# Patient Record
Sex: Female | Born: 1965
Health system: Southern US, Community
[De-identification: ages and names within clinical notes are randomized; demographics above are authoritative.]

## PROBLEM LIST (undated history)

## (undated) DIAGNOSIS — I10 Essential (primary) hypertension: Secondary | ICD-10-CM

## (undated) HISTORY — PX: UTERINE FIBROID SURGERY: SHX826

---

## 1998-07-31 ENCOUNTER — Emergency Department (HOSPITAL_COMMUNITY): Admission: EM | Admit: 1998-07-31 | Discharge: 1998-08-01 | Payer: Self-pay

## 2004-04-28 ENCOUNTER — Other Ambulatory Visit: Admission: RE | Admit: 2004-04-28 | Discharge: 2004-04-28 | Payer: Self-pay | Admitting: Gynecology

## 2004-07-18 ENCOUNTER — Inpatient Hospital Stay (HOSPITAL_COMMUNITY): Admission: RE | Admit: 2004-07-18 | Discharge: 2004-07-20 | Payer: Self-pay | Admitting: Gynecology

## 2011-05-09 ENCOUNTER — Emergency Department (HOSPITAL_BASED_OUTPATIENT_CLINIC_OR_DEPARTMENT_OTHER)
Admission: EM | Admit: 2011-05-09 | Discharge: 2011-05-09 | Disposition: A | Discharge status: Home / Self Care | Payer: Managed Care, Other (non HMO) | Attending: Emergency Medicine | Admitting: Emergency Medicine

## 2011-05-09 ENCOUNTER — Encounter: Payer: Self-pay | Admitting: Emergency Medicine

## 2011-05-09 DIAGNOSIS — M25561 Pain in right knee: Secondary | ICD-10-CM

## 2011-05-09 DIAGNOSIS — M25569 Pain in unspecified knee: Secondary | ICD-10-CM | POA: Insufficient documentation

## 2011-05-09 MED ORDER — IBUPROFEN 800 MG PO TABS: 800.0000 mg | ORAL_TABLET | Freq: Three times a day (TID) | ORAL | Status: AC

## 2011-05-09 MED ORDER — HYDROCODONE-ACETAMINOPHEN 5-325 MG PO TABS: 1.0000 | ORAL_TABLET | ORAL | Status: AC | PRN

## 2011-05-09 NOTE — ED Notes (Signed)
Pt reports loud pop of R knee last PM while getting up to stand no deformity no trauma

## 2011-05-09 NOTE — ED Provider Notes (Signed)
History     Chief Complaint  Patient presents with  . Knee Injury   HPI Pt was standing up from seated position yesterday evening when she heard/felt a pop in right knee.  Has had severe pain as well as edema ever since.  Able to bear weight but pain.  Pain aggravated by flexing as well.  No associated paresthesias.  Has had pain as well as intermittent popping/catching in both knees in the past and strong FH of arthritis. No recent injury to knee.  Otherwise feeling well.   No past medical history on file.  Past Surgical History  Procedure Date  . Colon surgery     History reviewed. No pertinent family history.  History  Substance Use Topics  . Smoking status: Not on file  . Smokeless tobacco: Not on file  . Alcohol Use: No    OB History    Grav Para Term Preterm Abortions TAB SAB Ect Mult Living                  Review of Systems  All other systems reviewed and are negative.    Physical Exam  BP 169/100  Pulse 99  Temp 98.7 F (37.1 C)  Resp 18  Physical Exam  Nursing note and vitals reviewed. Constitutional: She is oriented to person, place, and time. She appears well-developed and well-nourished. No distress.  HENT:  Head: Normocephalic and atraumatic.  Eyes:       Normal appearance  Neck: Normal range of motion.  Cardiovascular: Normal rate and regular rhythm.   Pulmonary/Chest: Effort normal and breath sounds normal.  Musculoskeletal:       Right knee: She exhibits no swelling, no ecchymosis, no deformity and no erythema. tenderness found.       No erythema or warmth.  Tenderness at medial joint line and medial to patella only.  Full active ROM but pain w/ complete flexion.  Distal NV intact.  Pt able to bear weight.    Neurological: She is alert and oriented to person, place, and time.  Skin: Skin is warm and dry. No rash noted.  Psychiatric: She has a normal mood and affect. Her behavior is normal.    ED Course  Procedures  MDM Pt presents w/  non-traumatic right knee pain since feeling a pop when standing from a seated position yesterday.  Has had pain, popping and catching in the recent past as well.  No signs of trauma or infection on exam.  Pt likely has a cartilage tear or acute flare of arthritis.  Pt has a knee sleeve at home and declines crutches.  Discharged home w/ vicodin, ibuprofen and recommendations for conservative therapy.  Referred to ortho.       Otilio Miu, PA 05/09/11 1407  Otilio Miu, [place] 05/09/11 [phone]

## 2011-05-26 ENCOUNTER — Other Ambulatory Visit: Payer: Self-pay | Admitting: Orthopedic Surgery

## 2011-05-26 ENCOUNTER — Ambulatory Visit
Admission: RE | Admit: 2011-05-26 | Discharge: 2011-05-26 | Disposition: A | Discharge status: Home / Self Care | Payer: Managed Care, Other (non HMO) | Source: Ambulatory Visit | Attending: Orthopedic Surgery | Admitting: Orthopedic Surgery

## 2011-05-26 DIAGNOSIS — M25561 Pain in right knee: Secondary | ICD-10-CM

## 2011-06-10 NOTE — ED Provider Notes (Signed)
Medical screening examination/treatment/procedure(s) were performed by non-physician practitioner and as supervising physician I was immediately available for consultation/collaboration. Osvaldo Human, M.D.  Carleene Cooper III, MD 06/10/11 2126

## 2014-12-07 ENCOUNTER — Encounter (HOSPITAL_BASED_OUTPATIENT_CLINIC_OR_DEPARTMENT_OTHER): Payer: Self-pay | Admitting: *Deleted

## 2014-12-07 ENCOUNTER — Emergency Department (HOSPITAL_BASED_OUTPATIENT_CLINIC_OR_DEPARTMENT_OTHER)
Admission: EM | Admit: 2014-12-07 | Discharge: 2014-12-07 | Disposition: A | Discharge status: Home / Self Care | Payer: Managed Care, Other (non HMO) | Attending: Emergency Medicine | Admitting: Emergency Medicine

## 2014-12-07 DIAGNOSIS — I10 Essential (primary) hypertension: Secondary | ICD-10-CM | POA: Insufficient documentation

## 2014-12-07 DIAGNOSIS — M79671 Pain in right foot: Secondary | ICD-10-CM | POA: Insufficient documentation

## 2014-12-07 DIAGNOSIS — M79672 Pain in left foot: Secondary | ICD-10-CM | POA: Insufficient documentation

## 2014-12-07 DIAGNOSIS — Z720 Tobacco use: Secondary | ICD-10-CM | POA: Insufficient documentation

## 2014-12-07 DIAGNOSIS — Z79899 Other long term (current) drug therapy: Secondary | ICD-10-CM | POA: Insufficient documentation

## 2014-12-07 DIAGNOSIS — M79606 Pain in leg, unspecified: Secondary | ICD-10-CM

## 2014-12-07 DIAGNOSIS — Z791 Long term (current) use of non-steroidal anti-inflammatories (NSAID): Secondary | ICD-10-CM | POA: Insufficient documentation

## 2014-12-07 DIAGNOSIS — R000 Tachycardia, unspecified: Secondary | ICD-10-CM | POA: Insufficient documentation

## 2014-12-07 HISTORY — DX: Essential (primary) hypertension: I10

## 2014-12-07 LAB — COMPREHENSIVE METABOLIC PANEL
ALT: 24 U/L (ref 0–35)
AST: 60 U/L — AB (ref 0–37)
Albumin: 4 g/dL (ref 3.5–5.2)
Alkaline Phosphatase: 75 U/L (ref 39–117)
Anion gap: 8 (ref 5–15)
BUN: 5 mg/dL — ABNORMAL LOW (ref 6–23)
CHLORIDE: 95 mmol/L — AB (ref 96–112)
CO2: 32 mmol/L (ref 19–32)
Calcium: 8.9 mg/dL (ref 8.4–10.5)
Creatinine, Ser: 0.48 mg/dL — ABNORMAL LOW (ref 0.50–1.10)
Glucose, Bld: 109 mg/dL — ABNORMAL HIGH (ref 70–99)
Potassium: 2.8 mmol/L — ABNORMAL LOW (ref 3.5–5.1)
SODIUM: 135 mmol/L (ref 135–145)
Total Bilirubin: 0.9 mg/dL (ref 0.3–1.2)
Total Protein: 7.3 g/dL (ref 6.0–8.3)

## 2014-12-07 LAB — CBC WITH DIFFERENTIAL/PLATELET
BASOS ABS: 0 10*3/uL (ref 0.0–0.1)
BASOS PCT: 0 % (ref 0–1)
Eosinophils Absolute: 0 10*3/uL (ref 0.0–0.7)
Eosinophils Relative: 0 % (ref 0–5)
HEMATOCRIT: 33.8 % — AB (ref 36.0–46.0)
Hemoglobin: 11 g/dL — ABNORMAL LOW (ref 12.0–15.0)
Lymphocytes Relative: 34 % (ref 12–46)
Lymphs Abs: 1.8 10*3/uL (ref 0.7–4.0)
MCH: 30.6 pg (ref 26.0–34.0)
MCHC: 32.5 g/dL (ref 30.0–36.0)
MCV: 94.2 fL (ref 78.0–100.0)
MONOS PCT: 10 % (ref 3–12)
Monocytes Absolute: 0.5 10*3/uL (ref 0.1–1.0)
Neutro Abs: 2.8 10*3/uL (ref 1.7–7.7)
Neutrophils Relative %: 56 % (ref 43–77)
Platelets: 276 10*3/uL (ref 150–400)
RBC: 3.59 MIL/uL — ABNORMAL LOW (ref 3.87–5.11)
RDW: 14 % (ref 11.5–15.5)
WBC: 5.1 10*3/uL (ref 4.0–10.5)

## 2014-12-07 LAB — CBG MONITORING, ED: Glucose-Capillary: 101 mg/dL — ABNORMAL HIGH (ref 70–99)

## 2014-12-07 MED ORDER — HYDROCODONE-ACETAMINOPHEN 5-325 MG PO TABS: 1.0000 | ORAL_TABLET | ORAL | Status: DC | PRN

## 2014-12-07 MED ORDER — SODIUM CHLORIDE 0.9 % IV BOLUS (SEPSIS)
1000.0000 mL | Freq: Once | INTRAVENOUS | Status: AC
Start: 2014-12-07 — End: 2014-12-07
  Administered 2014-12-07: 1000 mL via INTRAVENOUS

## 2014-12-07 MED ORDER — SODIUM CHLORIDE 0.9 % IV BOLUS (SEPSIS)
1000.0000 mL | Freq: Once | INTRAVENOUS | Status: AC
  Administered 2014-12-07: 1000 mL via INTRAVENOUS

## 2014-12-07 MED ORDER — POTASSIUM CHLORIDE CRYS ER 20 MEQ PO TBCR
40.0000 meq | EXTENDED_RELEASE_TABLET | Freq: Once | ORAL | Status: AC
  Administered 2014-12-07: 40 meq via ORAL
  Filled 2014-12-07: qty 2

## 2014-12-07 MED ORDER — METOPROLOL TARTRATE 1 MG/ML IV SOLN
2.5000 mg | Freq: Once | INTRAVENOUS | Status: AC
  Administered 2014-12-07: 2.5 mg via INTRAVENOUS
  Filled 2014-12-07: qty 5

## 2014-12-07 MED ORDER — METHOCARBAMOL 500 MG PO TABS: 500.0000 mg | ORAL_TABLET | Freq: Two times a day (BID) | ORAL | Status: DC

## 2014-12-07 MED ORDER — LORAZEPAM 2 MG/ML IJ SOLN
0.5000 mg | Freq: Once | INTRAMUSCULAR | Status: AC
  Administered 2014-12-07: 0.5 mg via INTRAVENOUS
  Filled 2014-12-07: qty 1

## 2014-12-07 MED ORDER — SODIUM CHLORIDE 0.9 % IV SOLN: INTRAVENOUS | Status: DC

## 2014-12-07 NOTE — ED Provider Notes (Addendum)
CSN: [id number]     Arrival date & time 12/07/14  0915 History   First MD Initiated Contact with Patient 12/07/14 0932     Chief Complaint  Patient presents with  . Numbness     (Consider location/radiation/quality/duration/timing/severity/associated sxs/prior Treatment) HPI Comments: History of present illness complaining of bilateral foot numbness 2 months which is been progressive. Complains of pain to the soles of her foot which is making difficult for her to walk. She notes that she has had some extension of the symptoms into her lower extremities. Denies any upper extremity symptoms. No headache. No polyuria polydipsia. Was seen at urgent care for similar symptoms and was told to monitor her situation. Denies any back pain. No bowel or bladder dysfunction. No visual changes. Symptoms are worse with ambulation better with rest.  The history is provided by the patient.    Past Medical History  Diagnosis Date  . Hypertension    Past Surgical History  Procedure Laterality Date  . Uterine fibroid surgery     No family history on file. History  Substance Use Topics  . Smoking status: Current Every Day Smoker    Types: Cigarettes  . Smokeless tobacco: Never Used  . Alcohol Use: Yes     Comment: 6/wk   OB History    No data available     Review of Systems  All other systems reviewed and are negative.     Allergies  Review of patient's allergies indicates no known allergies.  Home Medications   Prior to Admission medications   Medication Sig Start Date End Date Taking? Authorizing Provider  amLODipine (NORVASC) 10 MG tablet Take 10 mg by mouth daily.   Yes Historical Provider, MD  lisinopril (PRINIVIL,ZESTRIL) 40 MG tablet Take 40 mg by mouth daily.   Yes Historical Provider, MD  simvastatin (ZOCOR) 10 MG tablet Take 10 mg by mouth daily.   Yes Historical Provider, MD  naproxen sodium (ANAPROX) 220 MG tablet Take 220 mg by mouth 2 (two) times daily with a meal.       Historical Provider, MD   BP 152/97 mmHg  Pulse 118  Temp(Src) 98.2 F (36.8 C) (Oral)  Resp 18  SpO2 97% Physical Exam  Constitutional: She is oriented to person, place, and time. She appears well-developed and well-nourished.  Non-toxic appearance. No distress.  HENT:  Head: Normocephalic and atraumatic.  Eyes: Conjunctivae, EOM and lids are normal. Pupils are equal, round, and reactive to light.  Neck: Normal range of motion. Neck supple. No tracheal deviation present. No thyroid mass present.  Cardiovascular: Normal rate, regular rhythm and normal heart sounds.  Exam reveals no gallop.   No murmur heard. Pulmonary/Chest: Effort normal and breath sounds normal. No stridor. No respiratory distress. She has no decreased breath sounds. She has no wheezes. She has no rhonchi. She has no rales.  Abdominal: Soft. Normal appearance and bowel sounds are normal. She exhibits no distension. There is no tenderness. There is no rebound and no CVA tenderness.  Musculoskeletal: Normal range of motion. She exhibits no edema or tenderness.  No discoloration noted to her toes or feet bilaterally. Skin intact. Sensation normal.  Neurological: She is alert and oriented to person, place, and time. She has normal strength. No cranial nerve deficit or sensory deficit. GCS eye subscore is 4. GCS verbal subscore is 5. GCS motor subscore is 6.  Reflex Scores:      Patellar reflexes are 2+ on the right side and 2+ on  the left side. Skin: Skin is warm and dry. No abrasion and no rash noted.  Psychiatric: She has a normal mood and affect. Her speech is normal and behavior is normal.  Nursing note and vitals reviewed.   ED Course  Procedures (including critical care time) Labs Review Labs Reviewed - No data to display  Imaging Review No results found.   EKG Interpretation   Date/Time:  Friday December 07 2014 09:54:33 EST Ventricular Rate:  125 PR Interval:  138 QRS Duration: 72 QT Interval:   328 QTC Calculation: 473 R Axis:   49 Text Interpretation:  Sinus tachycardia Nonspecific ST abnormality  Abnormal ECG Confirmed by Jerico Grisso  MD, Gari Trovato ([id number]) on 12/07/2014 1:21:22  PM      MDM   Final diagnoses:  None    CBG checked here. Suspect the patient has claudication versus neuropathy. Will be given referral to neurology  1:19 PM Patient with tachycardia of unexplained etiology initially. Patient given Ativan with no response. She did admit to feeling more anxious.Given IV fluids and remained tachycardic. Patient admits now that she has not taken her antihypertensive medications and I think this could be a factor with her increased heart rate. She was given Lopressor 2.5 mg with a good response of her heart rate. She has remained asymptomatic and denies chest pain or palpitations or dyspnea. Will take her blood pressure medication when she gets home and was given referral as above.  CRITICAL CARE Performed by: Toy Baker Total critical care time: 45 Critical care time was exclusive of separately billable procedures and treating other patients. Critical care was necessary to treat or prevent imminent or life-threatening deterioration. Critical care was time spent personally by me on the following activities: development of treatment plan with patient and/or surrogate as well as nursing, discussions with consultants, evaluation of patient's response to treatment, examination of patient, obtaining history from patient or surrogate, ordering and performing treatments and interventions, ordering and review of laboratory studies, ordering and review of radiographic studies, pulse oximetry and re-evaluation of patient's condition.  Toy Baker, MD 12/07/14 6045  Toy Baker, MD 12/07/14 1321

## 2014-12-07 NOTE — ED Notes (Signed)
Reports numbness in both feet x 2 months- progressively worsening- states hurts to walk

## 2014-12-07 NOTE — ED Notes (Signed)
EDP back in with pt-per EDP he was advised by pt's boyfriend that pt has not been taking her BP meds

## 2014-12-07 NOTE — ED Notes (Signed)
MD at bedside. 

## 2014-12-07 NOTE — ED Notes (Signed)
EDP Allen updated on pt's VS

## 2014-12-07 NOTE — Discharge Instructions (Signed)
Return to the hospital if you develop trouble speaking, extension of the numbness into your arms, trouble breathing, or any other problems. Call the neurologist to schedule a follow-up visit

## 2014-12-12 ENCOUNTER — Encounter: Payer: Self-pay | Admitting: Diagnostic Neuroimaging

## 2014-12-12 ENCOUNTER — Ambulatory Visit (INDEPENDENT_AMBULATORY_CARE_PROVIDER_SITE_OTHER): Payer: Self-pay | Admitting: Diagnostic Neuroimaging

## 2014-12-12 VITALS — BP 148/99 | HR 140 | Ht 65.0 in | Wt 144.2 lb

## 2014-12-12 DIAGNOSIS — M79671 Pain in right foot: Secondary | ICD-10-CM

## 2014-12-12 DIAGNOSIS — M79672 Pain in left foot: Secondary | ICD-10-CM

## 2014-12-12 DIAGNOSIS — R200 Anesthesia of skin: Secondary | ICD-10-CM

## 2014-12-12 DIAGNOSIS — G609 Hereditary and idiopathic neuropathy, unspecified: Secondary | ICD-10-CM

## 2014-12-12 DIAGNOSIS — R208 Other disturbances of skin sensation: Secondary | ICD-10-CM

## 2014-12-12 MED ORDER — GABAPENTIN 300 MG PO CAPS: 300.0000 mg | ORAL_CAPSULE | Freq: Every day | ORAL | Status: DC

## 2014-12-12 NOTE — Patient Instructions (Signed)
I will check additional testing.  Follow up with primary doctor about high blood pressure and fast heart rate this week.  Try gabapentin 300mg  at bedtime for 1-2 weeks, then increase to twice a day.

## 2014-12-12 NOTE — Progress Notes (Signed)
[facility]  PATIENT: Laura Stafford DOB: [birth year]  REFERRING CLINICIAN: ER Cordelia Poche) HISTORY FROM: patient  REASON FOR VISIT: new consult    HISTORICAL  CHIEF COMPLAINT:  Chief Complaint  Patient presents with  . New Evaluation    tingling and pins and needles numbness in both feet worsening over past two months    HISTORY OF PRESENT ILLNESS:   49 year old right-handed female with hypertension, hypercholesteremia, daily alcohol use, here for evaluation of numbness and pain in feet and hands. November 2015 patient had onset of numbness and tingling in the toes, feet, which gradually spread to the ankles, calves and knees. Now she has some numbness and tingling in her hands for past few weeks. Patient describes pins and needles, painful sensation. Walking and physical activity make this worse.  Of note patient has been inconsistent with taking her medications for high blood pressure and high cholesterol. She was noted to be hypertensive and tachycardic in the emergency room a few days ago. Her vital signs show similar hypertension and tachycardia today. Patient reports daily alcohol use, at least 1-2 mixed drink per day every day. She smokes 5 cigars per day. She denies any illicit drug use or caffeine use.   REVIEW OF SYSTEMS: Full 14 system review of systems performed and notable only for joint swelling and numbness.   ALLERGIES: No Known Allergies  HOME MEDICATIONS: Outpatient Prescriptions Prior to Visit  Medication Sig Dispense Refill  . amLODipine (NORVASC) 10 MG tablet Take 10 mg by mouth daily.    Marland Kitchen HYDROcodone-acetaminophen (NORCO/VICODIN) 5-325 MG per tablet Take 1-2 tablets by mouth every 4 (four) hours as needed for moderate pain or severe pain. 15 tablet 0  . lisinopril (PRINIVIL,ZESTRIL) 40 MG tablet Take 40 mg by mouth daily.    . naproxen sodium (ANAPROX) 220 MG tablet Take 220 mg by mouth 2 (two) times daily with a meal.      .  simvastatin (ZOCOR) 10 MG tablet Take 10 mg by mouth daily.    . methocarbamol (ROBAXIN) 500 MG tablet Take 1 tablet (500 mg total) by mouth 2 (two) times daily. (Patient not taking: Reported on 12/12/2014) 20 tablet 0   No facility-administered medications prior to visit.    PAST MEDICAL HISTORY: Past Medical History  Diagnosis Date  . Hypertension     PAST SURGICAL HISTORY: Past Surgical History  Procedure Laterality Date  . Uterine fibroid surgery      FAMILY HISTORY: Family History  Problem Relation Age of Onset  . Hypertension Mother     SOCIAL HISTORY:  History   Social History  . Marital Status: Single    Spouse Name: N/A  . Number of Children: 0  . Years of Education: N/A   Occupational History  . [employer]    Social History Main Topics  . Smoking status: Current Every Day Smoker -- 0.25 packs/day for 0 years    Types: Cigarettes  . Smokeless tobacco: Never Used  . Alcohol Use: 4.2 - 8.4 oz/week    7-14 Shots of liquor per week     Comment: 1-2 mixed drinks per day, every day  . Drug Use: No  . Sexual Activity: Not on file   Other Topics Concern  . Not on file   Social History Narrative   Lives alone   Currently engaged to [place]   Drinks no caffeine      PHYSICAL EXAM  Filed Vitals:   12/12/14 1056  BP: 148/99  Pulse: 140  Height: 5\' 5"  (1.651 m)  Weight: 144 lb 3.2 oz (65.409 kg)    Body mass index is 24 kg/(m^2).  No exam data present  No flowsheet data found.  GENERAL EXAM: Patient is in no distress; well developed, nourished and groomed; neck is supple  CARDIOVASCULAR: TACHYCARDIA (120-140'S); REGULAR RHYTHM; No murmurs, no carotid bruits  NEUROLOGIC: MENTAL STATUS: awake, alert, oriented to person, place and time, recent and remote memory intact, normal attention and concentration, language fluent, comprehension intact, naming intact, fund of knowledge appropriate; FLAT AFFECT. CRANIAL NERVE: no papilledema on  fundoscopic exam, pupils equal and reactive to light, visual fields full to confrontation, extraocular muscles intact, no nystagmus, facial sensation and strength symmetric, hearing intact, palate elevates symmetrically, uvula midline, shoulder shrug symmetric, tongue midline. MOTOR: normal bulk and tone, full strength in the BUE, BLE SENSORY: DECR PP, TEMP VIB IN TOES/FEET; VIB <2 SEC AT TOES COORDINATION: finger-nose-finger, fine finger movements normal REFLEXES: deep tendon reflexes present and symmetric; ABSENT AT ANKLES; DOWN GOING TOES GAIT/STATION: narrow based gait; SLIGHTLY DIFF WITH TANDEM, TOE AND HEEL WALKING; romberg is negative    DIAGNOSTIC DATA (LABS, IMAGING, TESTING) - I reviewed patient records, labs, notes, testing and imaging myself where available.  Lab Results  Component Value Date   WBC 5.1 12/07/2014   HGB 11.0* 12/07/2014   HCT 33.8* 12/07/2014   MCV 94.2 12/07/2014   PLT 276 12/07/2014      Component Value Date/Time   NA 135 12/07/2014 1010   K 2.8* 12/07/2014 1010   CL 95* 12/07/2014 1010   CO2 32 12/07/2014 1010   GLUCOSE 109* 12/07/2014 1010   BUN 5* 12/07/2014 1010   CREATININE 0.48* 12/07/2014 1010   CALCIUM 8.9 12/07/2014 1010   PROT 7.3 12/07/2014 1010   ALBUMIN 4.0 12/07/2014 1010   AST 60* 12/07/2014 1010   ALT 24 12/07/2014 1010   ALKPHOS 75 12/07/2014 1010   BILITOT 0.9 12/07/2014 1010   GFRNONAA >90 12/07/2014 1010   GFRAA >90 12/07/2014 1010   No results found for: CHOL, HDL, LDLCALC, LDLDIRECT, TRIG, CHOLHDL No results found for: ZOXW9UHGBA1C No results found for: VITAMINB12 No results found for: TSH     ASSESSMENT AND PLAN  49 y.o. year old female here with uncontrolled hypertension, tachycardia, daily alcohol use, here for evaluation of numbness and tingling in the feet. Labs notable for anemia, hypokalemia, slight elevation of AST and glucose. I am concerned that patient does not appear to be taking good care of her general health  status.  Ddx: Most likely represents peripheral neuropathy, due to chronic alcohol use. We'll check lab testing so look for other metabolic causes.   PLAN: - I will prescribe gabapentin for symptom control.  - Advised patient to have close follow-up with PCP for treatment of hypertension and tachycardia.  - Cautioned patient regarding excessive alcohol use.   Orders Placed This Encounter  Procedures  . Vitamin B12  . Hemoglobin A1c  . TSH  . T4, Free    Meds ordered this encounter  Medications  . gabapentin (NEURONTIN) 300 MG capsule    Sig: Take 1 capsule (300 mg total) by mouth at bedtime.    Dispense:  90 capsule    Refill:  3    Return in about 3 months (around 03/12/2015).    Suanne MarkerVIKRAM R. PENUMALLI, MD 12/12/2014, 11:35 AM Certified in Neurology, Neurophysiology and Neuroimaging  [facility] [address], [address] [place],  [place] [address] [phone]

## 2014-12-13 LAB — T4, FREE: FREE T4: 1.1 ng/dL (ref 0.82–1.77)

## 2014-12-13 LAB — HEMOGLOBIN A1C
ESTIMATED AVERAGE GLUCOSE: 97 mg/dL
Hgb A1c MFr Bld: 5 % (ref 4.8–5.6)

## 2014-12-13 LAB — VITAMIN B12: VITAMIN B 12: 582 pg/mL (ref 211–946)

## 2014-12-13 LAB — TSH: TSH: 1.45 u[IU]/mL (ref 0.450–4.500)

## 2014-12-17 ENCOUNTER — Telehealth: Payer: Self-pay | Admitting: *Deleted

## 2014-12-17 NOTE — Telephone Encounter (Signed)
Patient was last seen last Wednesday and wants to discuss lab results and Gabapentin 300 mg. The medication is not helping with the numbness or the tingling.

## 2014-12-17 NOTE — Telephone Encounter (Signed)
Entered in error

## 2014-12-17 NOTE — Telephone Encounter (Signed)
Spoke with the pt on the phone and informed her that the gabapentin was not going to be an overnight fix that it would gradually make the tingling and the numbness better but it would take time. She wanted to know if she could take more than one a day. Looking into the chart, Dr. Marjory LiesPenumalli ordered for her to take 1 300 mg capsule at bedtime for 1-2 weeks and then increase to 1 300 mg capsule twice a day. I encouraged her to wait for the 2 week mark which would be next Wednesday march 9th but if she needed to start taking the two capsules a day she could start on Thursday march 3rd 2016. I also asked her about her BP and HR and she told me she has an appt with her PCP next week and she will be seeing them for a poss medication change. Asked her to call back if she had any further questions.

## 2014-12-31 ENCOUNTER — Telehealth: Payer: Self-pay | Admitting: Diagnostic Neuroimaging

## 2014-12-31 MED ORDER — GABAPENTIN 300 MG PO CAPS: 300.0000 mg | ORAL_CAPSULE | Freq: Two times a day (BID) | ORAL | Status: DC

## 2014-12-31 NOTE — Telephone Encounter (Signed)
Last OV note says: Try gabapentin 300mg  at bedtime for 1-2 weeks, then increase to twice a day Rx has been sent.  I called back to advise.  She is aware.

## 2014-12-31 NOTE — Telephone Encounter (Signed)
Pt is calling stating she needs a new Rx for  gabapentin (NEURONTIN) 300 MG capsule. The one at the pharmacy is for 1 a day, she states it should be 2 a day.  Please call and advise.

## 2015-01-08 ENCOUNTER — Telehealth: Payer: Self-pay | Admitting: Diagnostic Neuroimaging

## 2015-01-08 ENCOUNTER — Telehealth: Payer: Self-pay | Admitting: *Deleted

## 2015-01-08 ENCOUNTER — Encounter: Payer: Self-pay | Admitting: *Deleted

## 2015-01-08 NOTE — Telephone Encounter (Signed)
Increase gabapentin to three times per day. May follow up in clinic in next 1 month if needed. -VRP

## 2015-01-08 NOTE — Telephone Encounter (Signed)
Patient stated dosage increase of Rx gabapentin (NEURONTIN) 300 MG capsule, has not helped in pain or symptoms.  Please call and advise.

## 2015-01-08 NOTE — Telephone Encounter (Signed)
Patient stated shejust spoke with you Shanda BumpsJessica about her Rx Gabapetin 300 mg. Patient states she needs to talk with you some more about how the medication is working for her.  Thanks!

## 2015-01-08 NOTE — Telephone Encounter (Signed)
Patient called back.  Wanted to add that she is having tingling, numbness and spasm in feet.  Says her pain has increased since she was last seen.  Indicates at times she also has sharp pain between her toes, but it does not last very long.  She also wanted to relay she has decreased her alcohol intake.

## 2015-01-08 NOTE — Telephone Encounter (Signed)
Called and spoke with the pt encouraged her to take the gabapentin TID per Dr. Richrd HumblesPenumalli's order. I also told her that if in about a month the new dose was not helping, to call back and get another appt with us. She stated an understanding and a thanks.

## 2015-01-08 NOTE — Telephone Encounter (Signed)
I called back.  Please see previous note.

## 2015-01-08 NOTE — Telephone Encounter (Signed)
I called back and spoke with the patient.  Says she is taking Gabapentin twice daily, but is finding no pain relief.  Pain is mainly in her feet and toes.  States she has not been sleeping well, so has been taking Advil PM in the evening.  Would like a message sent to provider asking if they recommend a different prescription.  Please advise.  Thank you.

## 2015-01-16 ENCOUNTER — Telehealth: Payer: Self-pay | Admitting: Diagnostic Neuroimaging

## 2015-01-16 DIAGNOSIS — M792 Neuralgia and neuritis, unspecified: Secondary | ICD-10-CM

## 2015-01-16 NOTE — Telephone Encounter (Signed)
Patient is calling because the Gabapentin is causing swelling in her knees and feet. Please call to discuss. Thank you.

## 2015-01-17 MED ORDER — PREGABALIN 50 MG PO CAPS
50.0000 mg | ORAL_CAPSULE | Freq: Three times a day (TID) | ORAL | Status: DC
Start: 2015-01-17 — End: 2015-02-06

## 2015-01-17 NOTE — Telephone Encounter (Signed)
Foot pain , swelling under neurontin- she would like to change medication and feels the pain and dysesthesias are deeper than just skin deep, would not try a pain cream.  I suggested to try LYRICA> she would like to go ahead, CC Dr Mardi MainlandPanumalli

## 2015-01-18 ENCOUNTER — Telehealth: Payer: Self-pay | Admitting: Neurology

## 2015-01-18 NOTE — Telephone Encounter (Signed)
Reports Lyrica not at pharmacy  I called [facility]    [phone] and dictated prescription 50 mg po tid   #90 2 refills

## 2015-01-18 NOTE — Telephone Encounter (Signed)
Patient checking status of Rx Lyrica, please forward to [facility] [address] [place].  Questioning what dosage is she going to start off on?  Pain is really bad and would like to get this Rx today.  Please call advise.

## 2015-01-21 NOTE — Telephone Encounter (Signed)
Spoke with the pt on the phone and confirmed that she has picked up her lyrica. She stated that it is not working, I encouraged her to give the medication a few weeks to work best and that if she was still hurting, I would speak with Dr. Marjory LiesPenumalli about increasing her dose. She thanked me

## 2015-02-04 ENCOUNTER — Telehealth: Payer: Self-pay | Admitting: Diagnostic Neuroimaging

## 2015-02-04 NOTE — Telephone Encounter (Signed)
Patient called wanting to speak to a nurse regarding the swelling on both feet, ankles, and knees she is having LYRICA 50mg  tid. Please call and advice.

## 2015-02-05 NOTE — Telephone Encounter (Signed)
Called and spoke with the pt and she informed me that her feet were very swollen and painful and that she was wanting to come into the office to talk with him about different treatments options. I told her that I would speak with Dr. Marjory LiesPenumalli and get back with her. I was also able to get her a follow up appt tomorrow 02/06/15  Dr. Marjory LiesPenumalli told me to call her back and tell her that she should stop taking the lyrica and see if the swelling improves  I called the pt back and spoke with her and told her to stop taking the lyrica and that we would discuss the next steps at her appt tomorrow. She thanked me and stated an understanding

## 2015-02-06 ENCOUNTER — Ambulatory Visit (INDEPENDENT_AMBULATORY_CARE_PROVIDER_SITE_OTHER): Payer: Self-pay | Admitting: Diagnostic Neuroimaging

## 2015-02-06 ENCOUNTER — Encounter: Payer: Self-pay | Admitting: Diagnostic Neuroimaging

## 2015-02-06 VITALS — BP 155/107 | HR 121 | Ht 65.0 in | Wt 156.4 lb

## 2015-02-06 DIAGNOSIS — G609 Hereditary and idiopathic neuropathy, unspecified: Secondary | ICD-10-CM

## 2015-02-06 MED ORDER — AMITRIPTYLINE HCL 25 MG PO TABS: 25.0000 mg | ORAL_TABLET | Freq: Every day | ORAL | Status: DC

## 2015-02-06 NOTE — Patient Instructions (Signed)
Consider pain mgmt clinic.

## 2015-02-06 NOTE — Progress Notes (Signed)
[facility]  PATIENT: Laura Stafford DOB: Dec 12, 1965  REFERRING CLINICIAN: ER Cordelia Poche) HISTORY FROM: patient  REASON FOR VISIT: new consult    HISTORICAL  CHIEF COMPLAINT:  Chief Complaint  Patient presents with  . Follow-up    pain in both feet     HISTORY OF PRESENT ILLNESS:   UPDATE 02/06/15: Since last visit, tried gabapentin without relief. Then tried lyrica, without relief. She has cut down ETOH use to 1 drink per week. She developed some knee and ankle swelling since starting those meds, so stopped it last week to see if swelling got better. Swelling did slightly improve. She is wondering if she has gout.   PRIOR HPI (12/12/14): 49 year old right-handed female with hypertension, hypercholesteremia, daily  alcohol use, here for evaluation of numbness and pain in feet and hands. November 2015 patient had onset of numbness and tingling in the toes, feet, which gradually spread to the ankles, calves and knees. Now she has some numbness and tingling in her hands for past few weeks. Patient describes pins and needles, painful sensation. Walking and physical activity make this worse. Of note patient has been inconsistent with taking her medications for high blood pressure and high cholesterol. She was noted to be hypertensive and tachycardic in the emergency room a few days ago. Her vital signs show similar hypertension and tachycardia today. Patient reports daily alcohol use, at least 1-2 mixed drink per day every day. She smokes 5 cigars per day. She denies any illicit drug use or caffeine use.   REVIEW OF SYSTEMS: Full 14 system review of systems performed and notable only for joint swelling and numbness.   ALLERGIES: No Known Allergies  HOME MEDICATIONS: Outpatient Prescriptions Prior to Visit  Medication Sig Dispense Refill  . amLODipine (NORVASC) 10 MG tablet Take 10 mg by mouth daily.    Marland Kitchen lisinopril (PRINIVIL,ZESTRIL) 40 MG tablet Take 40 mg by mouth  daily.    . naproxen sodium (ANAPROX) 220 MG tablet Take 220 mg by mouth 2 (two) times daily with a meal.      . HYDROcodone-acetaminophen (NORCO/VICODIN) 5-325 MG per tablet Take 1-2 tablets by mouth every 4 (four) hours as needed for moderate pain or severe pain. 15 tablet 0  . methocarbamol (ROBAXIN) 500 MG tablet Take 1 tablet (500 mg total) by mouth 2 (two) times daily. (Patient not taking: Reported on 12/12/2014) 20 tablet 0  . pregabalin (LYRICA) 50 MG capsule Take 1 capsule (50 mg total) by mouth 3 (three) times daily. 90 capsule 0  . simvastatin (ZOCOR) 10 MG tablet Take 10 mg by mouth daily.     No facility-administered medications prior to visit.    PAST MEDICAL HISTORY: Past Medical History  Diagnosis Date  . Hypertension     PAST SURGICAL HISTORY: Past Surgical History  Procedure Laterality Date  . Uterine fibroid surgery      FAMILY HISTORY: Family History  Problem Relation Age of Onset  . Hypertension Mother     SOCIAL HISTORY:  History   Social History  . Marital Status: Single    Spouse Name: N/A  . Number of Children: 0  . Years of Education: N/A   Occupational History  . [employer]    Social History Main Topics  . Smoking status: Current Every Day Smoker -- 0.25 packs/day for 0 years    Types: Cigarettes  . Smokeless tobacco: Never Used  . Alcohol Use: 4.2 - 8.4 oz/week  7-14 Shots of liquor per week     Comment: 1-2 mixed drinks per day, every day  . Drug Use: No  . Sexual Activity: Not on file   Other Topics Concern  . Not on file   Social History Narrative   Lives alone   Currently engaged to [place]   Drinks no caffeine      PHYSICAL EXAM  Filed Vitals:   02/06/15 1338  BP: 155/107  Pulse: 121  Height:  (1.651 m)  Weight: 156 lb 6.4 oz (70.943 kg)    Body mass index is 26.03 kg/(m^2).  No exam data present  No flowsheet data found.  GENERAL EXAM: Patient is in no distress; well developed, nourished and  groomed; neck is supple  CARDIOVASCULAR: TACHYCARDIA (120-140'S); REGULAR RHYTHM; No murmurs, no carotid bruits  NEUROLOGIC: MENTAL STATUS: awake, alert, language fluent, comprehension intact, naming intact, fund of knowledge appropriate; FLAT AFFECT. CRANIAL NERVE: no papilledema on fundoscopic exam, pupils equal and reactive to light, visual fields full to confrontation, extraocular muscles intact, no nystagmus, facial sensation and strength symmetric, hearing intact, palate elevates symmetrically, uvula midline, shoulder shrug symmetric, tongue midline. MOTOR: normal bulk and tone, full strength in the BUE, BLE SENSORY: DECR PP, TEMP VIB IN TOES/FEET; VIB ABSENT AT TOES COORDINATION: finger-nose-finger, fine finger movements normal REFLEXES: deep tendon reflexes present and symmetric; ABSENT AT ANKLES; DOWN GOING TOES GAIT/STATION: narrow based gait; SLIGHTLY DIFF WITH TANDEM, TOE AND HEEL WALKING; romberg is negative    DIAGNOSTIC DATA (LABS, IMAGING, TESTING) - I reviewed patient records, labs, notes, testing and imaging myself where available.  Lab Results  Component Value Date   WBC 5.1 12/07/2014   HGB 11.0* 12/07/2014   HCT 33.8* 12/07/2014   MCV 94.2 12/07/2014   PLT 276 12/07/2014      Component Value Date/Time   NA 135 12/07/2014 1010   K 2.8* 12/07/2014 1010   CL 95* 12/07/2014 1010   CO2 32 12/07/2014 1010   GLUCOSE 109* 12/07/2014 1010   BUN 5* 12/07/2014 1010   CREATININE 0.48* 12/07/2014 1010   CALCIUM 8.9 12/07/2014 1010   PROT 7.3 12/07/2014 1010   ALBUMIN 4.0 12/07/2014 1010   AST 60* 12/07/2014 1010   ALT 24 12/07/2014 1010   ALKPHOS 75 12/07/2014 1010   BILITOT 0.9 12/07/2014 1010   GFRNONAA >90 12/07/2014 1010   GFRAA >90 12/07/2014 1010   No results found for: CHOL, HDL, LDLCALC, LDLDIRECT, TRIG, CHOLHDL Lab Results  Component Value Date   HGBA1C 5.0 12/12/2014   Lab Results  Component Value Date   VITAMINB12 582 12/12/2014   Lab Results    Component Value Date   TSH 1.450 12/12/2014       ASSESSMENT AND PLAN  49 y.o. year old female here with uncontrolled hypertension, tachycardia, daily alcohol use, here for evaluation of numbness and tingling in the feet. Labs notable for anemia, hypokalemia, slight elevation of AST and glucose.   Ddx: most likely represents peripheral neuropathy, due to chronic alcohol use; may have arthritis or other musculoskeletal cause of pain ss well  PLAN: - Advised patient to have close follow-up with PCP for treatment of hypertension and tachycardia, and possible eval/referrals to pain mgmt clinic, rheumatology or podiatry - trial of amitriptyline 25-50mg  qhs - Cautioned patient regarding excessive alcohol use.  Meds ordered this encounter  Medications  . amitriptyline (ELAVIL) 25 MG tablet    Sig: Take 1-2 tablets (25-50 mg total) by mouth at bedtime.  Dispense:  60 tablet    Refill:  3   Return in about 3 months (around 05/08/2015).  I spent 25 minutes of face to face time with patient. Greater than 50% of time was spent in counseling and coordination of care with patient.     Suanne MarkerVIKRAM R. Kelli Robeck, MD 02/06/2015, 2:37 PM Certified in Neurology, Neurophysiology and Neuroimaging  [facility] [address], [address] [place], [place] [id number] [phone]

## 2015-02-11 ENCOUNTER — Telehealth: Payer: Self-pay | Admitting: *Deleted

## 2015-02-11 NOTE — Telephone Encounter (Signed)
Spoke with the pt on the phone and cancelled her follow up appt that was scheduled 03/06/15 since she got an earlier appt 02/06/15. She was fine with this and also asked me about neuropathy cream. I looked in the note and did not see anything about that but she stated that her PCP had mentioned it and Dr. Marjory LiesPenumalli and herself had talked about it on her recent visit. I told her that I would talk with him tomorrow when he returned to the office and get back with her. She thanked me

## 2015-03-06 ENCOUNTER — Ambulatory Visit: Payer: Self-pay | Admitting: Diagnostic Neuroimaging

## 2015-05-08 ENCOUNTER — Telehealth: Payer: Self-pay | Admitting: Diagnostic Neuroimaging

## 2015-05-08 ENCOUNTER — Ambulatory Visit: Payer: Self-pay | Admitting: Diagnostic Neuroimaging

## 2015-05-08 NOTE — Telephone Encounter (Signed)
Patient is calling because she has questions about Lyrica which she has taken before.

## 2015-05-08 NOTE — Telephone Encounter (Signed)
I called back.  Got no answer.  Left message.  

## 2015-05-29 ENCOUNTER — Telehealth: Payer: Self-pay | Admitting: *Deleted

## 2015-05-29 MED ORDER — PREGABALIN 50 MG PO CAPS: 50.0000 mg | ORAL_CAPSULE | Freq: Three times a day (TID) | ORAL | Status: DC

## 2015-05-29 NOTE — Addendum Note (Signed)
Addended byJoycelyn Schmid on: 05/29/2015 04:48 PM   Modules accepted: Orders

## 2015-05-29 NOTE — Telephone Encounter (Signed)
Spoke with patient and informed her Lyrica prescription has been faxed to her pharmacy. Advised she call before she goes to be sure it is ready. She verbalized understanding, appreciation.

## 2015-05-29 NOTE — Telephone Encounter (Signed)
Call patient back [phone]

## 2015-05-29 NOTE — Telephone Encounter (Signed)
Rx has been signed and faxed.  I called the patient to advise.  Got no answer.  Left message.  

## 2015-05-29 NOTE — Telephone Encounter (Signed)
Spoke with patient who states she has seen Dr Marjory Lies in the past for pain in feet. She states she had been taking Lyrica but stopped due to swelling in her feet. She states she saw her PCP who changed her BP medication.  She states her new BP medication works well with Lyrica. She states she began taking Lyrica again approximately 2 months ago and states "it is working very well". She is requesting Dr Marjory Lies sends prescription to her pharmacy so she can continue on Lyrica. She states her PCP did not give her a new prescription. [facility] as [facility]  [address] in [place].  Informed her this RN will send her request to Dr Marjory Lies. She verbalized understanding, requested call back when prescription has been sent.

## 2015-05-29 NOTE — Telephone Encounter (Signed)
Please call patient back regarding Lyrica and going back on this medication

## 2016-02-18 ENCOUNTER — Other Ambulatory Visit: Payer: Self-pay | Admitting: Diagnostic Neuroimaging

## 2016-02-19 ENCOUNTER — Other Ambulatory Visit: Payer: Self-pay | Admitting: Diagnostic Neuroimaging

## 2016-02-20 ENCOUNTER — Other Ambulatory Visit: Payer: Self-pay | Admitting: Diagnostic Neuroimaging

## 2018-07-25 ENCOUNTER — Encounter (HOSPITAL_BASED_OUTPATIENT_CLINIC_OR_DEPARTMENT_OTHER): Payer: Self-pay

## 2018-07-25 ENCOUNTER — Emergency Department (HOSPITAL_BASED_OUTPATIENT_CLINIC_OR_DEPARTMENT_OTHER)
Admission: EM | Admit: 2018-07-25 | Discharge: 2018-07-25 | Disposition: A | Discharge status: Home / Self Care | Payer: Managed Care, Other (non HMO) | Attending: Emergency Medicine | Admitting: Emergency Medicine

## 2018-07-25 DIAGNOSIS — E876 Hypokalemia: Secondary | ICD-10-CM | POA: Insufficient documentation

## 2018-07-25 DIAGNOSIS — R1110 Vomiting, unspecified: Secondary | ICD-10-CM

## 2018-07-25 DIAGNOSIS — R197 Diarrhea, unspecified: Secondary | ICD-10-CM | POA: Insufficient documentation

## 2018-07-25 DIAGNOSIS — R8271 Bacteriuria: Secondary | ICD-10-CM

## 2018-07-25 DIAGNOSIS — F17210 Nicotine dependence, cigarettes, uncomplicated: Secondary | ICD-10-CM | POA: Insufficient documentation

## 2018-07-25 DIAGNOSIS — I10 Essential (primary) hypertension: Secondary | ICD-10-CM | POA: Insufficient documentation

## 2018-07-25 LAB — CBC WITH DIFFERENTIAL/PLATELET
Basophils Absolute: 0 10*3/uL (ref 0.0–0.1)
Basophils Relative: 0 %
EOS PCT: 0 %
Eosinophils Absolute: 0 10*3/uL (ref 0.0–0.7)
HEMATOCRIT: 42.6 % (ref 36.0–46.0)
Hemoglobin: 14.5 g/dL (ref 12.0–15.0)
LYMPHS ABS: 1.1 10*3/uL (ref 0.7–4.0)
LYMPHS PCT: 16 %
MCH: 33.6 pg (ref 26.0–34.0)
MCHC: 34 g/dL (ref 30.0–36.0)
MCV: 98.6 fL (ref 78.0–100.0)
MONOS PCT: 6 %
Monocytes Absolute: 0.4 10*3/uL (ref 0.1–1.0)
Neutro Abs: 5.4 10*3/uL (ref 1.7–7.7)
Neutrophils Relative %: 78 %
PLATELETS: 364 10*3/uL (ref 150–400)
RBC: 4.32 MIL/uL (ref 3.87–5.11)
RDW: 12.6 % (ref 11.5–15.5)
WBC: 6.9 10*3/uL (ref 4.0–10.5)

## 2018-07-25 LAB — COMPREHENSIVE METABOLIC PANEL
ALT: 26 U/L (ref 0–44)
AST: 50 U/L — ABNORMAL HIGH (ref 15–41)
Albumin: 4.6 g/dL (ref 3.5–5.0)
Alkaline Phosphatase: 100 U/L (ref 38–126)
Anion gap: 22 — ABNORMAL HIGH (ref 5–15)
BILIRUBIN TOTAL: 1.8 mg/dL — AB (ref 0.3–1.2)
BUN: 13 mg/dL (ref 6–20)
CO2: 25 mmol/L (ref 22–32)
CREATININE: 1.03 mg/dL — AB (ref 0.44–1.00)
Calcium: 10.9 mg/dL — ABNORMAL HIGH (ref 8.9–10.3)
Chloride: 92 mmol/L — ABNORMAL LOW (ref 98–111)
GFR calc non Af Amer: >60 mL/min (ref >60)
Glucose, Bld: 119 mg/dL — ABNORMAL HIGH (ref 70–99)
Potassium: 2.8 mmol/L — ABNORMAL LOW (ref 3.5–5.1)
Sodium: 139 mmol/L (ref 135–145)
Total Protein: 9 g/dL — ABNORMAL HIGH (ref 6.5–8.1)

## 2018-07-25 LAB — URINALYSIS, ROUTINE W REFLEX MICROSCOPIC
GLUCOSE, UA: NEGATIVE mg/dL
HGB URINE DIPSTICK: NEGATIVE
Ketones, ur: 15 mg/dL — AB
Nitrite: NEGATIVE
PH: 7 (ref 5.0–8.0)
Protein, ur: 30 mg/dL — AB
Specific Gravity, Urine: 1.02 (ref 1.005–1.030)

## 2018-07-25 LAB — URINALYSIS, MICROSCOPIC (REFLEX)

## 2018-07-25 LAB — MAGNESIUM: Magnesium: 1.7 mg/dL (ref 1.7–2.4)

## 2018-07-25 LAB — LIPASE, BLOOD: Lipase: 18 U/L (ref 11–51)

## 2018-07-25 LAB — TROPONIN I: Troponin I: <0.03 ng/mL (ref <0.03)

## 2018-07-25 MED ORDER — METOCLOPRAMIDE HCL 10 MG PO TABS: 10.0000 mg | ORAL_TABLET | Freq: Four times a day (QID) | ORAL | 0 refills | Status: DC | PRN

## 2018-07-25 MED ORDER — FAMOTIDINE 20 MG PO TABS: 20.0000 mg | ORAL_TABLET | Freq: Two times a day (BID) | ORAL | 0 refills | Status: DC | PRN

## 2018-07-25 MED ORDER — POTASSIUM CHLORIDE 10 MEQ/100ML IV SOLN
10.0000 meq | INTRAVENOUS | Status: AC
  Administered 2018-07-25 (×3): 10 meq via INTRAVENOUS
  Filled 2018-07-25 (×3): qty 100

## 2018-07-25 MED ORDER — ONDANSETRON HCL 4 MG/2ML IJ SOLN
4.0000 mg | Freq: Once | INTRAMUSCULAR | Status: AC
  Administered 2018-07-25: 4 mg via INTRAVENOUS
  Filled 2018-07-25: qty 2

## 2018-07-25 MED ORDER — SODIUM CHLORIDE 0.9 % IV BOLUS
2000.0000 mL | Freq: Once | INTRAVENOUS | Status: AC
  Administered 2018-07-25: 1000 mL via INTRAVENOUS

## 2018-07-25 MED ORDER — POTASSIUM CHLORIDE CRYS ER 20 MEQ PO TBCR
40.0000 meq | EXTENDED_RELEASE_TABLET | Freq: Once | ORAL | Status: AC
  Administered 2018-07-25: 40 meq via ORAL
  Filled 2018-07-25: qty 2

## 2018-07-25 MED ORDER — POTASSIUM CHLORIDE CRYS ER 20 MEQ PO TBCR: 20.0000 meq | EXTENDED_RELEASE_TABLET | Freq: Every day | ORAL | 0 refills | Status: DC

## 2018-07-25 MED ORDER — FAMOTIDINE IN NACL 20-0.9 MG/50ML-% IV SOLN
20.0000 mg | Freq: Once | INTRAVENOUS | Status: AC
  Administered 2018-07-25: 20 mg via INTRAVENOUS
  Filled 2018-07-25: qty 50

## 2018-07-25 MED ORDER — ONDANSETRON 4 MG PO TBDP
4.0000 mg | ORAL_TABLET | Freq: Once | ORAL | Status: AC
  Administered 2018-07-25: 4 mg via ORAL
  Filled 2018-07-25: qty 1

## 2018-07-25 MED FILL — POTASSIUM CL ER 20 MEQ TAB: 20 | 7 days supply | Qty: 7 | Fill #0

## 2018-07-25 MED FILL — FAMOTIDINE 20 MG TABLET: 20 | 5 days supply | Qty: 10 | Fill #0

## 2018-07-25 MED FILL — METOCLOPRAMIDE 10 MG TABLET: 10 | 3 days supply | Qty: 10 | Fill #0

## 2018-07-25 NOTE — ED Notes (Signed)
Pt given water for fluid challenge 

## 2018-07-25 NOTE — ED Triage Notes (Signed)
Pt c/o vomiting since Saturday.

## 2018-07-25 NOTE — ED Notes (Signed)
Assumed care. Pt denies n/v. Alert, no c/o IV med infusing.

## 2018-07-25 NOTE — ED Notes (Signed)
ED Provider at bedside. 

## 2018-07-25 NOTE — ED Provider Notes (Signed)
[facility] EMERGENCY DEPARTMENT Provider Note   CSN: [id number] Arrival date & time: 07/25/18  2841     History   Chief Complaint Chief Complaint  Patient presents with  . Emesis    HPI Laura Stafford is a 52 y.o. female hx of HTN, here presenting with vomiting, abdominal cramps.  Patient states that she started vomiting about 4 days ago.  She states that she was unable to keep anything down including water.  She states that she has some abdominal cramps as well but denies any diarrhea or fevers.  Denies any recent travel or eating uncooked food. Denies any sick contacts.   The history is provided by the patient.    Past Medical History:  Diagnosis Date  . Hypertension     There are no active problems to display for this patient.   Past Surgical History:  Procedure Laterality Date  . UTERINE FIBROID SURGERY       OB History   None      Home Medications    Prior to Admission medications   Not on File    Family History Family History  Problem Relation Age of Onset  . Hypertension Mother     Social History Social History   Tobacco Use  . Smoking status: Current Every Day Smoker    Packs/day: 0.25    Years: 0.00    Pack years: 0.00    Types: Cigarettes  . Smokeless tobacco: Never Used  Substance Use Topics  . Alcohol use: Yes    Alcohol/week: 7.0 - 14.0 standard drinks    Types: 7 - 14 Shots of liquor per week    Comment: 1-2 mixed drinks per day, every day  . Drug use: No     Allergies   Patient has no known allergies.   Review of Systems Review of Systems  Gastrointestinal: Positive for vomiting.  All other systems reviewed and are negative.    Physical Exam Updated Vital Signs BP (!) 175/124 (BP Location: Left Arm)   Pulse (!) 104   Temp 98.1 F (36.7 C) (Oral)   Resp 18   Ht 5\' 5"  (1.651 m)   Wt 61.2 kg   SpO2 100%   BMI 22.47 kg/m   Physical Exam  Constitutional: She is oriented to person, place, and time.    Dehydrated   HENT:  Head: Normocephalic.  MM dry   Eyes: Pupils are equal, round, and reactive to light. Conjunctivae and EOM are normal.  Neck: Normal range of motion. Neck supple.  Cardiovascular: Regular rhythm.  Tachycardic   Pulmonary/Chest: Effort normal and breath sounds normal. No respiratory distress.  Abdominal: Soft. Bowel sounds are normal.  Minimal epigastric tenderness, no RUQ tenderness, no rebound   Musculoskeletal: Normal range of motion.  Neurological: She is alert and oriented to person, place, and time. No cranial nerve deficit. Coordination normal.  Skin: Skin is warm. Capillary refill takes less than 2 seconds.  Psychiatric: She has a normal mood and affect.  Nursing note and vitals reviewed.    ED Treatments / Results  Labs (all labs ordered are listed, but only abnormal results are displayed) Labs Reviewed  COMPREHENSIVE METABOLIC PANEL - Abnormal; Notable for the following components:      Result Value   Potassium 2.8 (*)    Chloride 92 (*)    Glucose, Bld 119 (*)    Creatinine, Ser 1.03 (*)    Calcium 10.9 (*)    Total Protein 9.0 (*)  AST 50 (*)    Total Bilirubin 1.8 (*)    Anion gap 22 (*)    All other components within normal limits  URINALYSIS, ROUTINE W REFLEX MICROSCOPIC - Abnormal; Notable for the following components:   APPearance TURBID (*)    Bilirubin Urine MODERATE (*)    Ketones, ur 15 (*)    Protein, ur 30 (*)    Leukocytes, UA SMALL (*)    All other components within normal limits  URINALYSIS, MICROSCOPIC (REFLEX) - Abnormal; Notable for the following components:   Bacteria, UA MANY (*)    Trichomonas, UA PRESENT (*)    All other components within normal limits  URINE CULTURE  CBC WITH DIFFERENTIAL/PLATELET  LIPASE, BLOOD  TROPONIN I  MAGNESIUM    EKG EKG Interpretation  Date/Time:  Monday July 25 2018 09:12:23 EDT Ventricular Rate:  118 PR Interval:    QRS Duration: 90 QT Interval:  361 QTC  Calculation: 506 R Axis:   81 Text Interpretation:  Sinus tachycardia Ventricular premature complex Aberrant complex Consider right atrial enlargement Probable anteroseptal infarct, recent Lateral leads are also involved Baseline wander in lead(s) V6 No significant change since last tracing Confirmed by Richardean Canal [phone]) on 07/25/2018 9:14:31 AM Also confirmed by Richardean Canal [phone]), editor Elita Quick ([id number])  on 07/25/2018 11:45:16 AM   Radiology No results found.  Procedures Procedures (including critical care time)  Medications Ordered in ED Medications  sodium chloride 0.9 % bolus 2,000 mL (0 mLs Intravenous Stopped 07/25/18 1044)  ondansetron (ZOFRAN) injection 4 mg (4 mg Intravenous Given 07/25/18 0930)  famotidine (PEPCID) IVPB 20 mg premix (0 mg Intravenous Stopped 07/25/18 1044)  potassium chloride 10 mEq in 100 mL IVPB (10 mEq Intravenous New Bag/Given 07/25/18 1304)  potassium chloride SA (K-DUR,KLOR-CON) CR tablet 40 mEq (40 mEq Oral Given 07/25/18 1057)     Initial Impression / Assessment and Plan / ED Course  I have reviewed the triage vital signs and the nursing notes.  Pertinent labs & imaging results that were available during my care of the patient were reviewed by me and considered in my medical decision making (see chart for details).     Laura Stafford is a 52 y.o. female here with vomiting, epigastric pain. Afebrile, tachycardic. Likely dehydration from viral gastroenteritis. Will get labs, hydrate and give antiemetics and reassess.   2:20 PM Labs showed K 2.8, given 3 runs of IV potassium and 40 meq PO. AG 22 likely from dehydration. Given 2 L NS bolus. Tolerated PO in the ED. Abdomen nontender. WBC nl. Of note, UA + bacteria and some trichomonas. However, she has no vaginal discharge and denies hx of STDs. I think likely contamination so urine culture sent and will hold off on abx. Will dc home with reglan, pepcid, potassium supplement. Recommend repeat  potassium in a week.    Final Clinical Impressions(s) / ED Diagnoses   Final diagnoses:  None    ED Discharge Orders    None       Charlynne Pander, MD 07/25/18 1422

## 2018-07-25 NOTE — Discharge Instructions (Signed)
Stay hydrated. You likely have a stomach virus.   Take reglan for nausea.   Take pepcid for acid reflux symptoms  Take potassium as prescribed.   You need to see your doctor in a week to recheck your potassium and your blood pressure.   You have some bacteria in your urine. We sent off urine culture and you will be called in 2 days if there is significant amount of bacteria.   Return to ER if you have worse abdominal pain, vomiting, fever, pain with urination.

## 2018-07-27 LAB — URINE CULTURE

## 2018-07-28 ENCOUNTER — Telehealth: Payer: Self-pay | Admitting: *Deleted

## 2018-07-28 NOTE — Progress Notes (Signed)
ED Antimicrobial Stewardship Positive Culture Follow Up   Laura Stafford is an 52 y.o. female who presented to [facility] on 07/25/2018 with a chief complaint of vomiting and abdominal cramps. No reported urinary symptoms.   Chief Complaint  Patient presents with  . Emesis    Recent Results (from the past 720 hour(s))  Urine culture     Status: Abnormal   Collection Time: 07/25/18  1:47 PM  Result Value Ref Range Status   Specimen Description   Final    URINE, CLEAN CATCH Performed at [facility], [address] [place], [place] [address]    Special Requests   Final    NONE Performed at [facility], [address] [place], [place] [address]    Culture >=100,000 COLONIES/mL ESCHERICHIA COLI (A)  Final   Report Status 07/27/2018 FINAL  Final   Organism ID, Bacteria ESCHERICHIA COLI (A)  Final      Susceptibility   Escherichia coli - MIC*    AMPICILLIN <=2 SENSITIVE Sensitive     CEFAZOLIN <=4 SENSITIVE Sensitive     CEFTRIAXONE <=1 SENSITIVE Sensitive     CIPROFLOXACIN >=4 RESISTANT Resistant     GENTAMICIN <=1 SENSITIVE Sensitive     IMIPENEM <=0.25 SENSITIVE Sensitive     NITROFURANTOIN <=16 SENSITIVE Sensitive     TRIMETH/SULFA <=20 SENSITIVE Sensitive     AMPICILLIN/SULBACTAM <=2 SENSITIVE Sensitive     PIP/TAZO <=4 SENSITIVE Sensitive     Extended ESBL NEGATIVE Sensitive     * >=100,000 COLONIES/mL ESCHERICHIA COLI    No treatment at this time.  ED Provider: Loa Socks PA-C   [name] 07/28/2018, 9:17 AM Pharmacy Student Monday - Friday phone -  [phone] Saturday - Sunday phone - [phone]

## 2018-07-28 NOTE — Telephone Encounter (Signed)
Post ED Visit - Positive Culture Follow-up  Culture report reviewed by antimicrobial stewardship pharmacist:  []  Enzo Bi, Pharm.D. []  Celedonio Miyamoto, Pharm.D., BCPS AQ-ID []  Garvin Fila, Pharm.D., BCPS []  Georgina Pillion, Pharm.D., BCPS []  Louisa, [place].D., BCPS, AAHIVP []  Estella Husk, Pharm.D., BCPS, AAHIVP []  Lysle Pearl, PharmD, BCPS []  Phillips Climes, PharmD, BCPS []  Agapito Games, PharmD, BCPS []  Verlan Friends, PharmD  Positive urine culture, reviewed by Harvie Heck, PA-C  No urinary symptoms and no further patient follow-up is required at this time.  Laura Stafford 07/28/2018, 11:11 AM

## 2020-08-02 ENCOUNTER — Encounter (HOSPITAL_BASED_OUTPATIENT_CLINIC_OR_DEPARTMENT_OTHER): Payer: Self-pay | Admitting: *Deleted

## 2020-08-02 ENCOUNTER — Other Ambulatory Visit: Payer: Self-pay

## 2020-08-02 ENCOUNTER — Emergency Department (HOSPITAL_BASED_OUTPATIENT_CLINIC_OR_DEPARTMENT_OTHER)
Admission: EM | Admit: 2020-08-02 | Discharge: 2020-08-02 | Disposition: A | Discharge status: Home / Self Care | Payer: Self-pay | Attending: Emergency Medicine | Admitting: Emergency Medicine

## 2020-08-02 ENCOUNTER — Emergency Department (HOSPITAL_BASED_OUTPATIENT_CLINIC_OR_DEPARTMENT_OTHER): Payer: Self-pay

## 2020-08-02 DIAGNOSIS — G621 Alcoholic polyneuropathy: Secondary | ICD-10-CM | POA: Insufficient documentation

## 2020-08-02 DIAGNOSIS — D539 Nutritional anemia, unspecified: Secondary | ICD-10-CM

## 2020-08-02 DIAGNOSIS — F17210 Nicotine dependence, cigarettes, uncomplicated: Secondary | ICD-10-CM | POA: Insufficient documentation

## 2020-08-02 DIAGNOSIS — D649 Anemia, unspecified: Secondary | ICD-10-CM | POA: Insufficient documentation

## 2020-08-02 DIAGNOSIS — I10 Essential (primary) hypertension: Secondary | ICD-10-CM | POA: Insufficient documentation

## 2020-08-02 LAB — BASIC METABOLIC PANEL
Anion gap: 10 (ref 5–15)
BUN: 13 mg/dL (ref 6–20)
CO2: 23 mmol/L (ref 22–32)
Calcium: 9.3 mg/dL (ref 8.9–10.3)
Chloride: 100 mmol/L (ref 98–111)
Creatinine, Ser: 0.76 mg/dL (ref 0.44–1.00)
GFR, Estimated: >60 mL/min (ref >60)
Glucose, Bld: 101 mg/dL — ABNORMAL HIGH (ref 70–99)
Potassium: 3.2 mmol/L — ABNORMAL LOW (ref 3.5–5.1)
Sodium: 133 mmol/L — ABNORMAL LOW (ref 135–145)

## 2020-08-02 LAB — HEPATIC FUNCTION PANEL
ALT: 21 U/L (ref 0–44)
AST: 53 U/L — ABNORMAL HIGH (ref 15–41)
Albumin: 3.4 g/dL — ABNORMAL LOW (ref 3.5–5.0)
Alkaline Phosphatase: 177 U/L — ABNORMAL HIGH (ref 38–126)
Bilirubin, Direct: 0.5 mg/dL — ABNORMAL HIGH (ref 0.0–0.2)
Indirect Bilirubin: 0.6 mg/dL (ref 0.3–0.9)
Total Bilirubin: 1.1 mg/dL (ref 0.3–1.2)
Total Protein: 6.7 g/dL (ref 6.5–8.1)

## 2020-08-02 LAB — URINALYSIS, ROUTINE W REFLEX MICROSCOPIC
Bilirubin Urine: NEGATIVE
Glucose, UA: NEGATIVE mg/dL
Hgb urine dipstick: NEGATIVE
Ketones, ur: NEGATIVE mg/dL
Leukocytes,Ua: NEGATIVE
Nitrite: POSITIVE — AB
Protein, ur: NEGATIVE mg/dL
Specific Gravity, Urine: 1.015 (ref 1.005–1.030)
pH: 8 (ref 5.0–8.0)

## 2020-08-02 LAB — CBC
HCT: 25.7 % — ABNORMAL LOW (ref 36.0–46.0)
Hemoglobin: 8.4 g/dL — ABNORMAL LOW (ref 12.0–15.0)
MCH: 34.6 pg — ABNORMAL HIGH (ref 26.0–34.0)
MCHC: 32.7 g/dL (ref 30.0–36.0)
MCV: 105.8 fL — ABNORMAL HIGH (ref 80.0–100.0)
Platelets: 247 10*3/uL (ref 150–400)
RBC: 2.43 MIL/uL — ABNORMAL LOW (ref 3.87–5.11)
RDW: 15.6 % — ABNORMAL HIGH (ref 11.5–15.5)
WBC: 8.2 10*3/uL (ref 4.0–10.5)
nRBC: 0 % (ref 0.0–0.2)

## 2020-08-02 LAB — TSH: TSH: 1.619 u[IU]/mL (ref 0.350–4.500)

## 2020-08-02 LAB — URINALYSIS, MICROSCOPIC (REFLEX)

## 2020-08-02 LAB — OCCULT BLOOD X 1 CARD TO LAB, STOOL: Fecal Occult Bld: NEGATIVE

## 2020-08-02 LAB — CBG MONITORING, ED: Glucose-Capillary: 95 mg/dL (ref 70–99)

## 2020-08-02 LAB — FOLATE: Folate: 2.9 ng/mL — ABNORMAL LOW (ref >5.9)

## 2020-08-02 LAB — VITAMIN B12: Vitamin B-12: 302 pg/mL (ref 180–914)

## 2020-08-02 MED ORDER — FOLIC ACID 1 MG PO TABS: 1.0000 mg | ORAL_TABLET | Freq: Every day | ORAL | 0 refills | Status: AC

## 2020-08-02 MED ORDER — THIAMINE HCL 100 MG PO TABS: 100.0000 mg | ORAL_TABLET | Freq: Every day | ORAL | 0 refills | Status: AC

## 2020-08-02 MED ORDER — SODIUM CHLORIDE 0.9 % IV SOLN
1000.0000 mL | INTRAVENOUS | Status: DC
  Administered 2020-08-02: 1000 mL via INTRAVENOUS

## 2020-08-02 MED ORDER — GABAPENTIN 100 MG PO CAPS: 100.0000 mg | ORAL_CAPSULE | Freq: Three times a day (TID) | ORAL | 0 refills | Status: DC

## 2020-08-02 MED ORDER — THIAMINE HCL 100 MG PO TABS: 100.0000 mg | ORAL_TABLET | Freq: Every day | ORAL | 0 refills | Status: DC

## 2020-08-02 MED ORDER — SODIUM CHLORIDE 0.9 % IV BOLUS (SEPSIS)
1000.0000 mL | Freq: Once | INTRAVENOUS | Status: AC
  Administered 2020-08-02: 1000 mL via INTRAVENOUS

## 2020-08-02 MED ORDER — VITAMIN B-12 100 MCG PO TABS: 100.0000 ug | ORAL_TABLET | Freq: Every day | ORAL | 0 refills | Status: DC

## 2020-08-02 MED ORDER — LABETALOL HCL 100 MG PO TABS: 100.0000 mg | ORAL_TABLET | Freq: Two times a day (BID) | ORAL | 0 refills | Status: AC

## 2020-08-02 MED ORDER — FOLIC ACID 1 MG PO TABS: 1.0000 mg | ORAL_TABLET | Freq: Every day | ORAL | 0 refills | Status: DC

## 2020-08-02 MED ORDER — LABETALOL HCL 100 MG PO TABS: 100.0000 mg | ORAL_TABLET | Freq: Two times a day (BID) | ORAL | 0 refills | Status: DC

## 2020-08-02 MED ORDER — VITAMIN B-12 100 MCG PO TABS: 100.0000 ug | ORAL_TABLET | Freq: Every day | ORAL | 0 refills | Status: AC

## 2020-08-02 MED ORDER — GABAPENTIN 100 MG PO CAPS: 100.0000 mg | ORAL_CAPSULE | Freq: Three times a day (TID) | ORAL | 0 refills | Status: AC

## 2020-08-02 MED ORDER — AMLODIPINE BESYLATE 5 MG PO TABS: 5.0000 mg | ORAL_TABLET | Freq: Every day | ORAL | 0 refills | Status: DC

## 2020-08-02 MED FILL — FOLIC ACID 1 MG TABS: 1 | 30 days supply | Qty: 30 | Fill #0

## 2020-08-02 MED FILL — AMLODIPINE BESYLATE 5 MG TA: 5 | 30 days supply | Qty: 30 | Fill #0

## 2020-08-02 NOTE — ED Provider Notes (Signed)
[facility] EMERGENCY DEPARTMENT Provider Note   CSN: [id number] Arrival date & time: 08/02/20  1229     History Fatigue and weakness  Laura Stafford is a 54 y.o. female.  HPI   Patient states she has been having difficulty that is gradually been getting worse over the last 6 months or so.  Patient states she has noticed gradual increasing weakness primarily in her lower extremities.  Patient states she gets a numbness and tingling in her bilateral feet and lower extremities.  She also feels that her legs get fatigued.  Patient has not had any falls.  She is still able to walk without difficulty.  She denies any problems with headache or chest pain.  Patient states she has been losing weight over the last 6 months.  Patient does drink alcohol daily.  Patient states she will have a few cocktails every day.  Patient does have history of hypertension but has not been taking any medication and has not seen a primary care doctor in a while.  Past Medical History:  Diagnosis Date  . Hypertension     There are no problems to display for this patient.   Past Surgical History:  Procedure Laterality Date  . UTERINE FIBROID SURGERY       OB History   No obstetric history on file.     Family History  Problem Relation Age of Onset  . Hypertension Mother     Social History   Tobacco Use  . Smoking status: Current Every Day Smoker    Packs/day: 0.25    Years: 0.00    Pack years: 0.00    Types: Cigarettes  . Smokeless tobacco: Never Used  Substance Use Topics  . Alcohol use: Yes    Alcohol/week: 7.0 - 14.0 standard drinks    Types: 7 - 14 Shots of liquor per week    Comment: 1-2 mixed drinks per day, every day  . Drug use: No    Home Medications Prior to Admission medications   Not on File    Allergies    Patient has no known allergies.  Review of Systems   Review of Systems  All other systems reviewed and are negative.   Physical Exam Updated  Vital Signs BP (!) 184/101 (BP Location: Right Arm)   Pulse (!) 115   Temp 98.6 F (37 C) (Oral)   Resp 16   Ht 1.651 m (5\' 5" )   Wt 52.4 kg   SpO2 99%   BMI 19.22 kg/m   Physical Exam Vitals and nursing note reviewed.  Constitutional:      Appearance: She is not diaphoretic.     Comments: Underweight  HENT:     Head: Normocephalic and atraumatic.     Right Ear: External ear normal.     Left Ear: External ear normal.  Eyes:     General: No scleral icterus.       Right eye: No discharge.        Left eye: No discharge.     Conjunctiva/sclera: Conjunctivae normal.  Neck:     Trachea: No tracheal deviation.  Cardiovascular:     Rate and Rhythm: Regular rhythm. Tachycardia present.  Pulmonary:     Effort: Pulmonary effort is normal. No respiratory distress.     Breath sounds: Normal breath sounds. No stridor. No wheezing or rales.  Abdominal:     General: Bowel sounds are normal. There is no distension.     Palpations: Abdomen  is soft.     Tenderness: There is no abdominal tenderness. There is no guarding or rebound.  Genitourinary:    Comments: Brown stool Musculoskeletal:        General: No tenderness.     Cervical back: Neck supple.  Skin:    General: Skin is warm and dry.     Findings: No rash.  Neurological:     General: No focal deficit present.     Mental Status: She is alert.     Cranial Nerves: No cranial nerve deficit (no facial droop, extraocular movements intact, no slurred speech).     Sensory: No sensory deficit.     Motor: No abnormal muscle tone or seizure activity.     Coordination: Coordination normal.     Comments: Sensation intact all extremities, equal grip strength bilaterally, patient able to lift her legs off the bed without difficulty bilaterally, no facial droop     ED Results / Procedures / Treatments   Labs (all labs ordered are listed, but only abnormal results are displayed) Labs Reviewed  BASIC METABOLIC PANEL - Abnormal; Notable  for the following components:      Result Value   Sodium 133 (*)    Potassium 3.2 (*)    Glucose, Bld 101 (*)    All other components within normal limits  CBC - Abnormal; Notable for the following components:   RBC 2.43 (*)    Hemoglobin 8.4 (*)    HCT 25.7 (*)    MCV 105.8 (*)    MCH 34.6 (*)    RDW 15.6 (*)    All other components within normal limits  URINALYSIS, ROUTINE W REFLEX MICROSCOPIC - Abnormal; Notable for the following components:   Nitrite POSITIVE (*)    All other components within normal limits  HEPATIC FUNCTION PANEL - Abnormal; Notable for the following components:   Albumin 3.4 (*)    AST 53 (*)    Alkaline Phosphatase 177 (*)    Bilirubin, Direct 0.5 (*)    All other components within normal limits  URINALYSIS, MICROSCOPIC (REFLEX) - Abnormal; Notable for the following components:   Bacteria, UA MANY (*)    All other components within normal limits  OCCULT BLOOD X 1 CARD TO LAB, STOOL  VITAMIN B12  FOLATE  TSH  CBG MONITORING, ED  CBG MONITORING, ED    EKG EKG Interpretation  Date/Time:  Friday August 02 2020 13:00:52 EDT Ventricular Rate:  127 PR Interval:  150 QRS Duration: 66 QT Interval:  310 QTC Calculation: 450 R Axis:   34 Text Interpretation: Sinus tachycardia Right atrial enlargement Nonspecific ST abnormality Abnormal ECG No significant change since last tracing Confirmed by Linwood Dibbles [phone]) on 08/02/2020 1:10:48 PM   Radiology DG Chest Portable 1 View  Result Date: 08/02/2020 CLINICAL DATA:  Weakness EXAM: PORTABLE CHEST 1 VIEW COMPARISON:  None. FINDINGS: The heart size and mediastinal contours are within normal limits. Both lungs are clear. The visualized skeletal structures are unremarkable. IMPRESSION: No active disease. Electronically Signed   By: Duanne Guess D.O.   On: 08/02/2020 14:02    Procedures Procedures (including critical care time)  Medications Ordered in ED Medications  sodium chloride 0.9 % bolus 1,000  mL (0 mLs Intravenous Stopped 08/02/20 1405)    Followed by  0.9 %  sodium chloride infusion (1,000 mLs Intravenous New Bag/Given 08/02/20 1456)    ED Course  I have reviewed the triage vital signs and the nursing notes.  Pertinent  labs & imaging results that were available during my care of the patient were reviewed by me and considered in my medical decision making (see chart for details).  Clinical Course as of Aug 02 1712  Fri Aug 02, 2020  1331 Patient's numbness and weakness suggestive more of some type of neuropathy.  Patient is not having back pain.  Argues against spinal etiologies such as discitis or spinal stenosis.  Alcohol use suggest the possibility of neuropathy associated with B12 or folate deficiency.  Thyroid disease is also a concern   [JK]  1512 Patient's hemoglobin is decreased compared to previous values.  MCV is elevated consistent with macrocytic anemia   [JK]    Clinical Course User Index [JK] Linwood Dibbles, MD   MDM Rules/Calculators/A&P                          Pt presented with complaints of numbness and tingling diffusely.  On exam no acute neuro deficits noted.  Doubt acute cns etiology.  ED workup notable to macrocytic anemia.  Suspect pts alcohol use contributing to macrocytic anemia and polyneuropathy.  Will add thiamine and folate labs.  TSH also ordered to evaluate for possible thyrotoxicosis.   Tachycardia has improved with I hydration.  Pt does not appear tremulous.  Doubt DT, severe alcohol withdrawal.  Plan on dc home with outpt follow.  Discussed findings with patient and concern regarding alcohol use.    HTN also noted.  Pt has not been compliant with medications.  Stressed importance of close outpt follow up.  Dr Dalene Seltzer will follow up on tsh Final Clinical Impression(s) / ED Diagnoses Final diagnoses:  Macrocytic anemia  Alcohol-induced polyneuropathy (HCC)  Hypertension, unspecified type    Rx / DC Orders ED Discharge Orders    None         Linwood Dibbles, MD 08/02/20 1713

## 2020-08-02 NOTE — Discharge Instructions (Signed)
Follow up with a primary care doctor to follow up on your blood pressure, neuropathy, anemia and hypertension

## 2020-08-02 NOTE — ED Triage Notes (Signed)
She is ambulatory to triage. For a while her feet and fingers have felt numb and tingling. Chronic pain in her knees. Difficulty sleeping. Gradual weight loss over the past 6 months.

## 2020-08-02 NOTE — ED Provider Notes (Signed)
  Physical Exam  BP (!) 184/101 (BP Location: Right Arm)   Pulse (!) 115   Temp 98.6 F (37 C) (Oral)   Resp 16   Ht 5\' 5"  (1.651 m)   Wt 52.4 kg   SpO2 99%   BMI 19.22 kg/m   Physical Exam  ED Course/Procedures   Clinical Course as of Aug 02 1814  Fri Aug 02, 2020  1331 Patient's numbness and weakness suggestive more of some type of neuropathy.  Patient is not having back pain.  Argues against spinal etiologies such as discitis or spinal stenosis.  Alcohol use suggest the possibility of neuropathy associated with B12 or folate deficiency.  Thyroid disease is also a concern   [JK]  1512 Patient's hemoglobin is decreased compared to previous values.  MCV is elevated consistent with macrocytic anemia   [JK]    Clinical Course User Index [JK] Aug 04, 2020, MD    Procedures  MDM  Received care of patient at 4 PM from Dr. Linwood Dibbles.  Please see his note for prior history, physical and care.  Briefly this is a 54 year old female who presents with concern for numbness of her distal extremities.  Her labs are significant for new anemia with high MCV.  Hemoccult negative.  Signed out with TSH pending.  She is noted to be tachycardic to the 120s on arrival to the emergency department, however is improved to 105.  She is not tremulous, overall does not appear to be in alcohol withdrawal and she denies possibility of this.  She denies other infectious symptoms.  Given her hypertension, we will plan to start labetalol for blood pressures and mildly elevated heart rate.  She has been waiting for the TSH results for a long time and wishes to go home, and I expect it will take longer for the results to return.  At this time with heart rate improved I do not believe that the result of the TSH will change her plan of care at this time.  Considered wet beriberi as etiology of her tachycardia with other findings concerning for vitamin deficiency, however her chest x-ray shows normal-sized heart without signs  of pulmonary edema.  I feel that concern for folate, thiamine or B12 deficiency may be continued as an outpatient.  Discussed recommendation that she follow-up closely with the primary care physician.  She currently has an appointment scheduled, however recommend moving that appointment up in setting of this emergency department visit, discovery of anemia, likely vitamin deficiency and possible thyroid disease.  Given prescription for folate, thiamine, B12, labetalol, and gabapentin for pain. Patient discharged in stable condition with understanding of reasons to return.      57, MD 08/03/20 1536

## 2022-03-19 IMAGING — DX DG CHEST 1V PORT
1 series · 1 of 1 positions shown · non-contrast
Comparison: None.

CLINICAL DATA: Weakness

EXAM:
PORTABLE CHEST 1 VIEW

[chest ap]
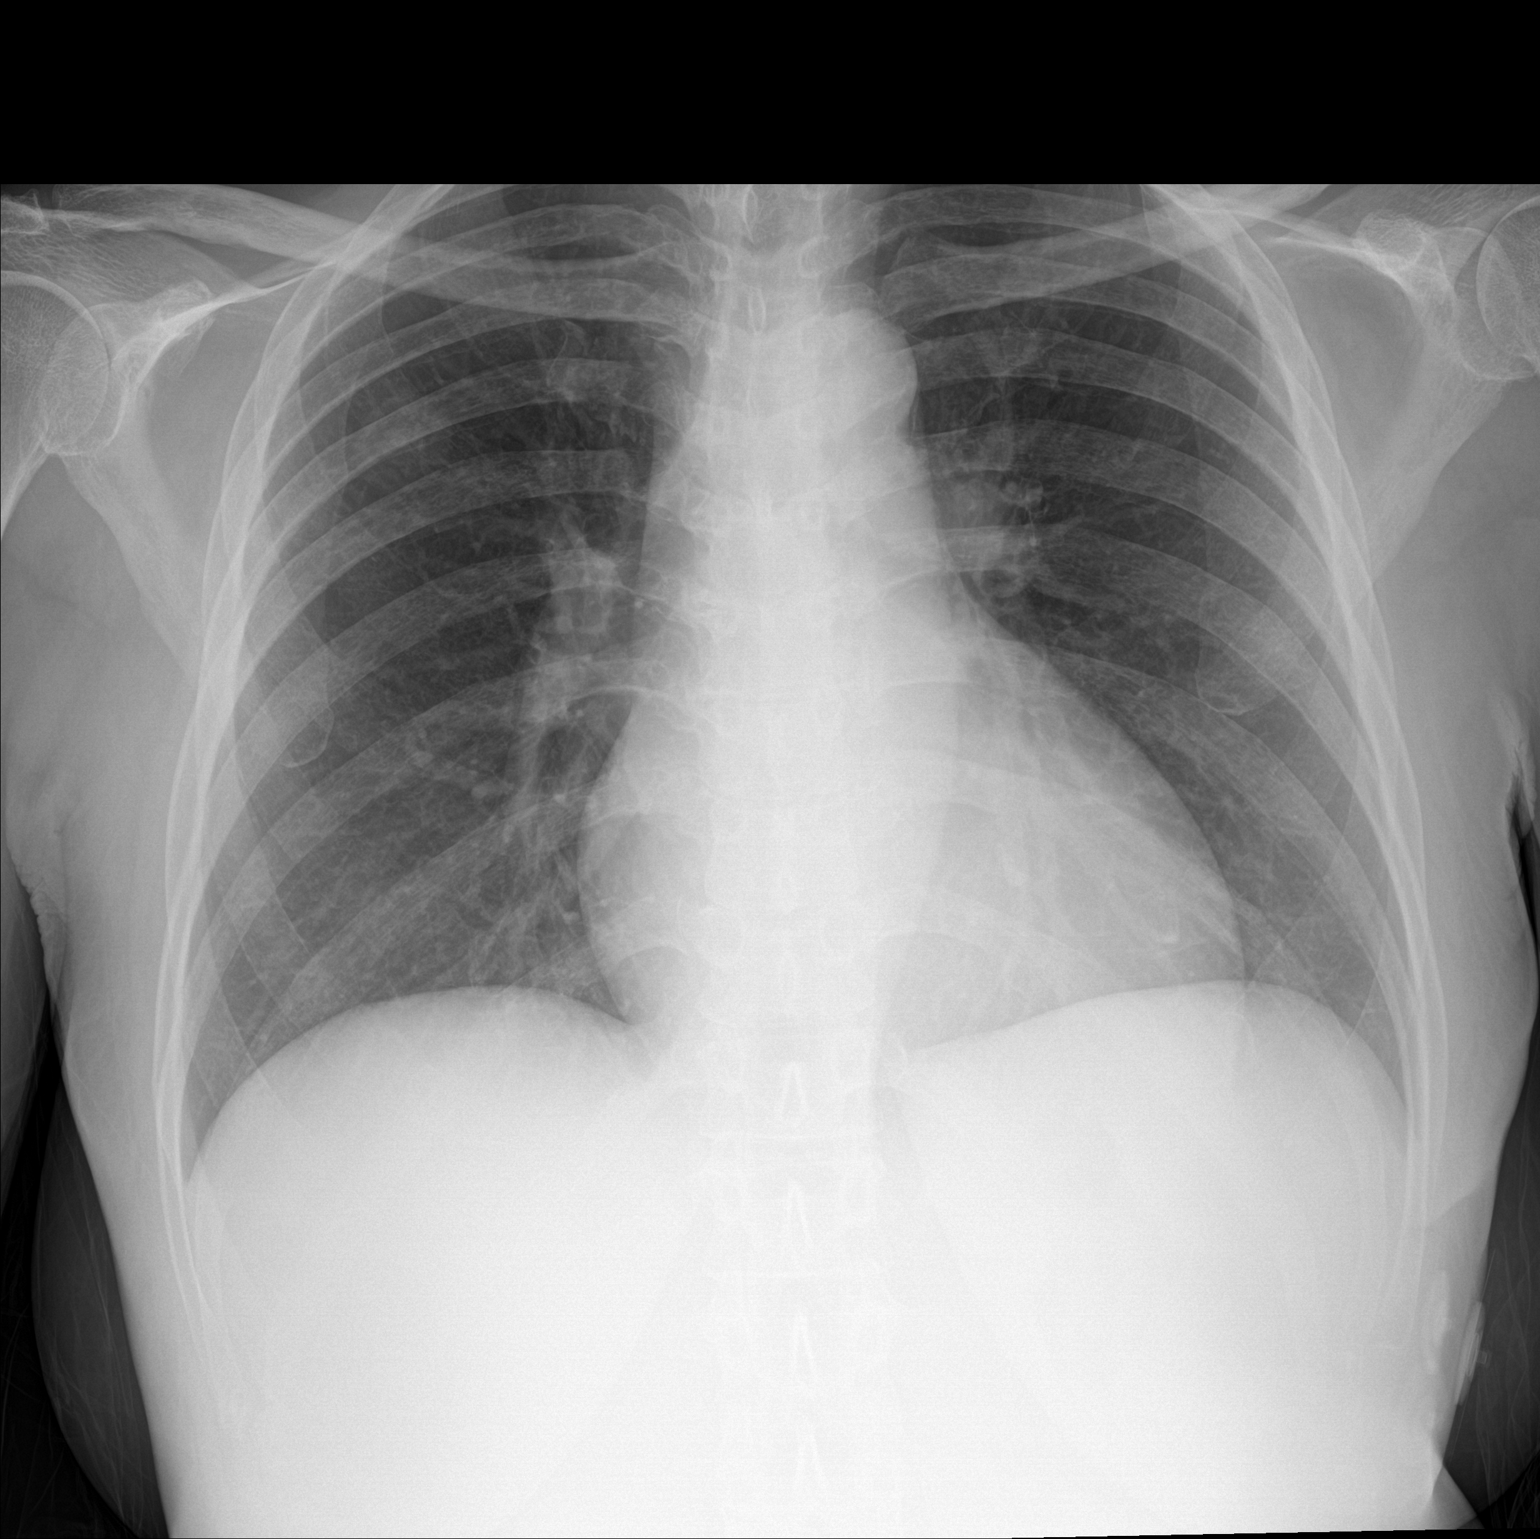

[1 of 1 positions shown; findings below may reference images not displayed]

FINDINGS: The heart size and mediastinal contours are within normal limits.
Both lungs are clear. The visualized skeletal structures are
unremarkable.
IMPRESSION: No active disease.

## 2023-12-30 ENCOUNTER — Ambulatory Visit: Payer: Self-pay | Admitting: Dermatology

## 2024-03-01 ENCOUNTER — Ambulatory Visit: Payer: PRIVATE HEALTH INSURANCE | Admitting: Dermatology

## 2024-03-01 DIAGNOSIS — R21 Rash and other nonspecific skin eruption: Secondary | ICD-10-CM

## 2024-03-01 DIAGNOSIS — D492 Neoplasm of unspecified behavior of bone, soft tissue, and skin: Secondary | ICD-10-CM | POA: Diagnosis not present

## 2024-03-01 DIAGNOSIS — D489 Neoplasm of uncertain behavior, unspecified: Secondary | ICD-10-CM

## 2024-03-01 DIAGNOSIS — L820 Inflamed seborrheic keratosis: Secondary | ICD-10-CM

## 2024-03-01 NOTE — Patient Instructions (Addendum)
 Date: Wed Mar 01 2024  Hello Laura Stafford,  Thank you for visiting today. Here is a summary of the key instructions:  - Wound Care:   - Apply Aquaphor ointment to the biopsy site   - Keep a band-aid on the biopsy site   - Keep the scab moist and do not let it dry out   - Continue wound care for about 10 days  - Follow-up:   - Return in 10 days for a follow-up appointment   - We will review the biopsy results and create a treatment plan at that time  - Skin Care:   - Continue using your current face washing routine   - Do not start any new creams until after we review the biopsy results  Please reach out if you have any questions or concerns.  Warm regards,  Dr. [name], Dermatology    Patient Handout: Wound Care for Skin Biopsy Site  Taking Care of Your Skin Biopsy Site  Proper care of the biopsy site is essential for promoting healing and minimizing scarring. This handout provides instructions on how to care for your biopsy site to ensure optimal recovery.  1. Cleaning the Wound:  Clean the biopsy site daily with gentle soap and water. Gently pat the area dry with a clean, soft towel. Avoid harsh scrubbing or rubbing the area, as this can irritate the skin and delay healing.  2. Applying Aquaphor and Bandage:  After cleaning the wound, apply a thin layer of Aquaphor ointment to the biopsy site. Cover the area with a sterile bandage to protect it from dirt, bacteria, and friction. Change the bandage daily or as needed if it becomes soiled or wet.  3. Continued Care for One Week:  Repeat the cleaning, Aquaphor application, and bandaging process daily for one week following the biopsy procedure. Keeping the wound clean and moist during this initial healing period will help prevent infection and promote optimal healing.  4. Massaging Aquaphor into the Area:  ---After one week, discontinue the use of bandages but continue to apply Aquaphor to the biopsy  site. ----Gently massage the Aquaphor into the area using circular motions. ---Massaging the skin helps to promote circulation and prevent the formation of scar tissue.   Additional Tips:  Avoid exposing the biopsy site to direct sunlight during the healing process, as this can cause hyperpigmentation or worsen scarring. If you experience any signs of infection, such as increased redness, swelling, warmth, or drainage from the wound, contact your healthcare provider immediately. Follow any additional instructions provided by your healthcare provider for caring for the biopsy site and managing any discomfort. Conclusion:  Taking proper care of your skin biopsy site is crucial for ensuring optimal healing and minimizing scarring. By following these instructions for cleaning, applying Aquaphor, and massaging the area, you can promote a smooth and successful recovery. If you have any questions or concerns about caring for your biopsy site, don't hesitate to contact your healthcare provider for guidance.    Important Information  Due to recent changes in healthcare laws, you may see results of your pathology and/or laboratory studies on MyChart before the doctors have had a chance to review them. We understand that in some cases there may be results that are confusing or concerning to you. Please understand that not all results are received at the same time and often the doctors may need to interpret multiple results in order to provide you with the best plan of care or course  of treatment. Therefore, we ask that you please give us  2 business days to thoroughly review all your results before contacting the office for clarification. Should we see a critical lab result, you will be contacted sooner.   If You Need Anything After Your Visit  If you have any questions or concerns for your doctor, please call our main line at [phone] If no one answers, please leave a voicemail as directed and we will  return your call as soon as possible. Messages left after 4 pm will be answered the following business day.   You may also send us  a message via MyChart. We typically respond to MyChart messages within 1-2 business days.  For prescription refills, please ask your pharmacy to contact our office. Our fax number is [phone].  If you have an urgent issue when the clinic is closed that cannot wait until the next business day, you can page your doctor at the number below.    Please note that while we do our best to be available for urgent issues outside of office hours, we are not available 24/7.   If you have an urgent issue and are unable to reach us , you may choose to seek medical care at your doctor's office, retail clinic, urgent care center, or emergency room.  If you have a medical emergency, please immediately call 911 or go to the emergency department. In the event of inclement weather, please call our main line at [phone] for an update on the status of any delays or closures.  Dermatology Medication Tips: Please keep the boxes that topical medications come in in order to help keep track of the instructions about where and how to use these. Pharmacies typically print the medication instructions only on the boxes and not directly on the medication tubes.   If your medication is too expensive, please contact our office at [phone] or send us  a message through MyChart.   We are unable to tell what your co-pay for medications will be in advance as this is different depending on your insurance coverage. However, we may be able to find a substitute medication at lower cost or fill out paperwork to get insurance to cover a needed medication.   If a prior authorization is required to get your medication covered by your insurance company, please allow us  1-2 business days to complete this process.  Drug prices often vary depending on where the prescription is filled and some pharmacies  may offer cheaper prices.  The website www.goodrx.com contains coupons for medications through different pharmacies. The prices here do not account for what the cost may be with help from insurance (it may be cheaper with your insurance), but the website can give you the price if you did not use any insurance.  - You can print the associated coupon and take it with your prescription to the pharmacy.  - You may also stop by our office during regular business hours and pick up a GoodRx coupon card.  - If you need your prescription sent electronically to a different pharmacy, notify our office through [facility] or by phone at [phone]

## 2024-03-01 NOTE — Progress Notes (Unsigned)
   New Patient Visit   Subjective  Laura Stafford is a 58 y.o. female who presents for the following: Break out of face  Patient states she has break out of face located at the face that she would like to have examined. Patient reports the areas have been there for 6 months. She reports the areas are bothersome.Patient rates irritation no irritation. She states that the areas have not spread.Patient reports she has not previously been treated for these areas.Patient stated that she was given hydrocortisone cream but didn't use it .   The patient has spots, moles and lesions to be evaluated, some may be new or changing and the patient may have concern these could be cancer.   The following portions of the chart were reviewed this encounter and updated as appropriate: medications, allergies, medical history  Review of Systems:  No other skin or systemic complaints except as noted in HPI or Assessment and Plan.  Objective  Well appearing patient in no apparent distress; mood and affect are within normal limits.  A focused examination was performed of the following areas: Face  Relevant exam findings are noted in the Assessment and Plan.         Assessment & Plan   1. Facial eruptions and discoloration - Assessment: Patient presents with flesh-colored to hypopigmented, flat-topped papules with minimal scale involving the central face, glabella, nose, nasolabial folds, chin, around the lips, and scattered on the cheeks. Condition has persisted for at least 6 months, with some improvement but not complete resolution. Patient reports worsening with seasonal changes and occasional dryness. No associated itching. Previous biopsy performed about a year ago, but results unavailable. Differential diagnosis includes sarcoidosis, granuloma faciale, and seborrheic dermatitis.  - Plan:    Perform shave biopsy of a lesion on the chin     - Informed consent obtained for the procedure     - Local  anesthesia administered     - Specimen to be sent to pathology with clinical suspicion of sarcoidosis vs. granuloma faciale vs. seborrheic dermatitis    Provide post-biopsy care instructions:     - Apply Aquaphor and keep the area covered with a band-aid     - Maintain a moist environment to prevent scab formation and minimize scarring    Defer topical treatments until biopsy results are available    Schedule follow-up appointment in 10 days to review biopsy results and discuss treatment plan  NEOPLASM OF UNCERTAIN BEHAVIOR Head - Anterior (Face) Skin / nail biopsy Type of biopsy: tangential   Informed consent: discussed and consent obtained   Timeout: patient name, date of birth, surgical site, and procedure verified   Procedure prep:  Patient was prepped and draped in usual sterile fashion Prep type:  Isopropyl alcohol Anesthesia: the lesion was anesthetized in a standard fashion   Anesthetic:  1% lidocaine w/ epinephrine 1-100,000 buffered w/ 8.4% NaHCO3 Instrument used: flexible razor blade   Hemostasis achieved with: pressure, aluminum chloride and electrodesiccation   Outcome: patient tolerated procedure well   Post-procedure details: sterile dressing applied and wound care instructions given   Dressing type: bandage and petrolatum    Return in about 10 days (around 03/11/2024).  Exie Holler, CMA, am acting as scribe for [employer], DO.   Documentation: I have reviewed the above documentation for accuracy and completeness, and I agree with the above.  [name], DO

## 2024-03-07 ENCOUNTER — Ambulatory Visit: Payer: Self-pay | Admitting: Dermatology

## 2024-03-08 NOTE — Telephone Encounter (Signed)
-----   Message from [name] sent at 03/07/2024  5:18 PM EDT ----- Laura Stafford,  This patient has no active mychart.  Please call patient with path results. Reassure results were benign and no additional treatment is required.  Thanks!  Diagnosis Skin , head - anterior (face) SEBORRHEIC KERATOSIS, INFLAMED

## 2024-03-08 NOTE — Telephone Encounter (Signed)
 03/08/24- Advised patient of results

## 2024-03-14 ENCOUNTER — Encounter: Payer: Self-pay | Admitting: Dermatology

## 2024-03-14 ENCOUNTER — Ambulatory Visit (INDEPENDENT_AMBULATORY_CARE_PROVIDER_SITE_OTHER): Payer: PRIVATE HEALTH INSURANCE | Admitting: Dermatology

## 2024-03-14 VITALS — BP 184/119

## 2024-03-14 DIAGNOSIS — B078 Other viral warts: Secondary | ICD-10-CM

## 2024-03-14 DIAGNOSIS — L509 Urticaria, unspecified: Secondary | ICD-10-CM | POA: Diagnosis not present

## 2024-03-14 MED ORDER — TRETINOIN 0.025 % EX CREA: TOPICAL_CREAM | Freq: Every day | CUTANEOUS | 3 refills | Status: AC

## 2024-03-14 MED ORDER — TRIAMCINOLONE ACETONIDE 0.1 % EX CREA: 1.0000 | TOPICAL_CREAM | Freq: Two times a day (BID) | CUTANEOUS | 2 refills | Status: AC

## 2024-03-14 NOTE — Progress Notes (Signed)
   Follow-Up Visit   Subjective  Laura Stafford is a 58 y.o. female who presents for the following: Biopsy follow up - Review Bx Results    The following portions of the chart were reviewed this encounter and updated as appropriate: medications, allergies, medical history  Review of Systems:  No other skin or systemic complaints except as noted in HPI or Assessment and Plan.  Objective  Well appearing patient in no apparent distress; mood and affect are within normal limits.  A focused examination was performed of the following areas:   Relevant exam findings are noted in the Assessment and Plan.    Assessment & Plan   1. Hives - Assessment: Patient reports experiencing hives that dry up, indicative of an allergic reaction. The exact cause of the allergic reaction has not been identified. There is a concern about potential dark spots developing from the hives. - Plan:    Apply triamcinolone cream twice daily for up to 2 weeks during flare-ups    Take daily antihistamine (Allegra, Claritin, or Zyrtec) during peak allergy seasons    Identify and avoid potential allergens  2. Possible Flat Warts (Requested re-read on Pathology) - Assessment: The pathologist's report does not indicate sarcoidosis. Clinical presentation suggests possible flat warts, described as small, flat-topped papules. A previous biopsy was inconclusive between sarcoidosis and granulomatous eruptions. Further investigation is needed to confirm the diagnosis. - Plan:    Apply tretinoin 0.025% topically     - Start with pea-sized amount twice weekly for the first month     - If no irritation, increase to three nights per week after the first month    Apply hyaluronic acid before tretinoin to reduce dryness and irritation    Use gentle cleanser (e.g., Cetaphil or CeraVe)    Avoid products with active ingredients like salicylic acid    Morning skincare routine:     - Wash face     - Apply hyaluronic acid     -  Apply moisturizer    Evening skincare routine:     - Wash face     - Apply hyaluronic acid     - Apply moisturizer     - Apply tretinoin (on designated nights)    Take close-up pictures to track changes    Follow up in 3 months to assess progress  3. Skin Dryness - Assessment: Patient reports skin dryness. Recommended use of hyaluronic acid for moisture retention. - Plan:    Use [place] brand hyaluronic acid    Apply moisturizer after hyaluronic acid if skin feels dry  Follow-up in 3 months to assess progress of possible flat warts treatment and overall skin condition.   Return in about 3 months (around 06/14/2024) for Follow up.  I, Eliot Guernsey, CMA, am acting as scribe for [employer], DO .   Documentation: I have reviewed the above documentation for accuracy and completeness, and I agree with the above.  [name], DO

## 2024-03-14 NOTE — Patient Instructions (Addendum)
 Dear Adell Hones,  Thank you for visiting today. Here is a summary of the key instructions:  - Medications:   - Use triamcinolone cream twice a day for up to 2 weeks when hives appear   - Take Allegra, Claritin, or Zyrtec daily during peak allergy seasons   - Apply tretinoin 0.025% cream:     - Use a pea-sized amount twice a week for the first month     - If no irritation, increase to three nights a week after the first month   - Use [place] hyaluronic acid serum  - Skin Care Routine:   Morning:   - Wash face with gentle cleanser (Cetaphil or CeraVe)   - Apply hyaluronic acid serum   - Apply moisturizer    Night:   - Wash face with gentle cleanser (Cetaphil or CeraVe)   - Apply hyaluronic acid serum   - Apply moisturizer   - Apply tretinoin (on scheduled nights)  - Lifestyle Changes:   - Avoid triggers that cause hives   - Avoid skincare products with active ingredients like salicylic acid  - Follow-up:   - Return for follow-up appointment in 3 months  - Other Instructions:   - Send a MyChart message if medication costs are too high   - Use GoodRx or compounding pharmacies for potentially cheaper medication options  Please reach out if you have any questions or concerns.  Warm regards,  Dr. [name], Dermatology        Important Information  Due to recent changes in healthcare laws, you may see results of your pathology and/or laboratory studies on MyChart before the doctors have had a chance to review them. We understand that in some cases there may be results that are confusing or concerning to you. Please understand that not all results are received at the same time and often the doctors may need to interpret multiple results in order to provide you with the best plan of care or course of treatment. Therefore, we ask that you please give us  2 business days to thoroughly review all your results before contacting the office for clarification. Should we see a critical  lab result, you will be contacted sooner.   If You Need Anything After Your Visit  If you have any questions or concerns for your doctor, please call our main line at [phone] If no one answers, please leave a voicemail as directed and we will return your call as soon as possible. Messages left after 4 pm will be answered the following business day.   You may also send us  a message via MyChart. We typically respond to MyChart messages within 1-2 business days.  For prescription refills, please ask your pharmacy to contact our office. Our fax number is [phone].  If you have an urgent issue when the clinic is closed that cannot wait until the next business day, you can page your doctor at the number below.    Please note that while we do our best to be available for urgent issues outside of office hours, we are not available 24/7.   If you have an urgent issue and are unable to reach us , you may choose to seek medical care at your doctor's office, retail clinic, urgent care center, or emergency room.  If you have a medical emergency, please immediately call 911 or go to the emergency department. In the event of inclement weather, please call our main line at [phone] for an update on the status  of any delays or closures.  Dermatology Medication Tips: Please keep the boxes that topical medications come in in order to help keep track of the instructions about where and how to use these. Pharmacies typically print the medication instructions only on the boxes and not directly on the medication tubes.   If your medication is too expensive, please contact our office at [phone] or send us  a message through MyChart.   We are unable to tell what your co-pay for medications will be in advance as this is different depending on your insurance coverage. However, we may be able to find a substitute medication at lower cost or fill out paperwork to get insurance to cover a needed  medication.   If a prior authorization is required to get your medication covered by your insurance company, please allow us  1-2 business days to complete this process.  Drug prices often vary depending on where the prescription is filled and some pharmacies may offer cheaper prices.  The website www.goodrx.com contains coupons for medications through different pharmacies. The prices here do not account for what the cost may be with help from insurance (it may be cheaper with your insurance), but the website can give you the price if you did not use any insurance.  - You can print the associated coupon and take it with your prescription to the pharmacy.  - You may also stop by our office during regular business hours and pick up a GoodRx coupon card.  - If you need your prescription sent electronically to a different pharmacy, notify our office through [facility] or by phone at [phone]

## 2024-03-16 LAB — SURGICAL PATHOLOGY

## 2024-06-28 ENCOUNTER — Encounter: Payer: Self-pay | Admitting: Dermatology

## 2024-06-29 ENCOUNTER — Ambulatory Visit: Payer: PRIVATE HEALTH INSURANCE | Admitting: Dermatology

## 2024-11-29 ENCOUNTER — Ambulatory Visit: Payer: PRIVATE HEALTH INSURANCE | Admitting: Dermatology
